# Patient Record
Sex: Female | Born: 1940 | Race: White | Hispanic: No | Marital: Married
Health system: Southern US, Community
[De-identification: ages and names within clinical notes are randomized; demographics above are authoritative.]

## PROBLEM LIST (undated history)

## (undated) DIAGNOSIS — I2510 Atherosclerotic heart disease of native coronary artery without angina pectoris: Secondary | ICD-10-CM

## (undated) DIAGNOSIS — I10 Essential (primary) hypertension: Secondary | ICD-10-CM

---

## 2009-04-05 ENCOUNTER — Ambulatory Visit: Payer: Self-pay | Admitting: Internal Medicine

## 2016-11-15 DIAGNOSIS — Z1211 Encounter for screening for malignant neoplasm of colon: Secondary | ICD-10-CM | POA: Diagnosis not present

## 2016-11-21 DIAGNOSIS — I10 Essential (primary) hypertension: Secondary | ICD-10-CM | POA: Diagnosis not present

## 2017-02-13 DIAGNOSIS — I2510 Atherosclerotic heart disease of native coronary artery without angina pectoris: Secondary | ICD-10-CM | POA: Diagnosis not present

## 2017-02-13 DIAGNOSIS — I10 Essential (primary) hypertension: Secondary | ICD-10-CM | POA: Diagnosis not present

## 2017-02-13 DIAGNOSIS — I5022 Chronic systolic (congestive) heart failure: Secondary | ICD-10-CM | POA: Diagnosis not present

## 2017-03-07 DIAGNOSIS — D485 Neoplasm of uncertain behavior of skin: Secondary | ICD-10-CM | POA: Diagnosis not present

## 2017-03-07 DIAGNOSIS — D0439 Carcinoma in situ of skin of other parts of face: Secondary | ICD-10-CM | POA: Diagnosis not present

## 2017-03-22 DIAGNOSIS — C44321 Squamous cell carcinoma of skin of nose: Secondary | ICD-10-CM | POA: Diagnosis not present

## 2017-05-03 DIAGNOSIS — I25119 Atherosclerotic heart disease of native coronary artery with unspecified angina pectoris: Secondary | ICD-10-CM | POA: Diagnosis not present

## 2017-05-03 DIAGNOSIS — I10 Essential (primary) hypertension: Secondary | ICD-10-CM | POA: Diagnosis not present

## 2017-05-03 DIAGNOSIS — I2510 Atherosclerotic heart disease of native coronary artery without angina pectoris: Secondary | ICD-10-CM | POA: Diagnosis not present

## 2017-07-26 DIAGNOSIS — L570 Actinic keratosis: Secondary | ICD-10-CM | POA: Diagnosis not present

## 2017-07-26 DIAGNOSIS — Z872 Personal history of diseases of the skin and subcutaneous tissue: Secondary | ICD-10-CM | POA: Diagnosis not present

## 2017-07-26 DIAGNOSIS — L821 Other seborrheic keratosis: Secondary | ICD-10-CM | POA: Diagnosis not present

## 2017-07-26 DIAGNOSIS — Z859 Personal history of malignant neoplasm, unspecified: Secondary | ICD-10-CM | POA: Diagnosis not present

## 2017-10-08 DIAGNOSIS — Z859 Personal history of malignant neoplasm, unspecified: Secondary | ICD-10-CM | POA: Diagnosis not present

## 2017-10-08 DIAGNOSIS — Z872 Personal history of diseases of the skin and subcutaneous tissue: Secondary | ICD-10-CM | POA: Diagnosis not present

## 2017-10-08 DIAGNOSIS — L570 Actinic keratosis: Secondary | ICD-10-CM | POA: Diagnosis not present

## 2017-10-08 DIAGNOSIS — L578 Other skin changes due to chronic exposure to nonionizing radiation: Secondary | ICD-10-CM | POA: Diagnosis not present

## 2017-10-31 DIAGNOSIS — Q386 Other congenital malformations of mouth: Secondary | ICD-10-CM | POA: Diagnosis not present

## 2018-02-11 DIAGNOSIS — I2510 Atherosclerotic heart disease of native coronary artery without angina pectoris: Secondary | ICD-10-CM | POA: Diagnosis not present

## 2018-02-11 DIAGNOSIS — I10 Essential (primary) hypertension: Secondary | ICD-10-CM | POA: Diagnosis not present

## 2018-02-26 DIAGNOSIS — I2510 Atherosclerotic heart disease of native coronary artery without angina pectoris: Secondary | ICD-10-CM | POA: Diagnosis not present

## 2018-03-29 DIAGNOSIS — I10 Essential (primary) hypertension: Secondary | ICD-10-CM | POA: Diagnosis not present

## 2018-03-29 DIAGNOSIS — I2510 Atherosclerotic heart disease of native coronary artery without angina pectoris: Secondary | ICD-10-CM | POA: Diagnosis not present

## 2018-04-08 DIAGNOSIS — Z859 Personal history of malignant neoplasm, unspecified: Secondary | ICD-10-CM | POA: Diagnosis not present

## 2018-04-08 DIAGNOSIS — L821 Other seborrheic keratosis: Secondary | ICD-10-CM | POA: Diagnosis not present

## 2018-04-08 DIAGNOSIS — Z872 Personal history of diseases of the skin and subcutaneous tissue: Secondary | ICD-10-CM | POA: Diagnosis not present

## 2018-04-08 DIAGNOSIS — L578 Other skin changes due to chronic exposure to nonionizing radiation: Secondary | ICD-10-CM | POA: Diagnosis not present

## 2018-05-17 DIAGNOSIS — I10 Essential (primary) hypertension: Secondary | ICD-10-CM | POA: Diagnosis not present

## 2018-07-02 DIAGNOSIS — Z13220 Encounter for screening for lipoid disorders: Secondary | ICD-10-CM | POA: Diagnosis not present

## 2018-07-02 DIAGNOSIS — I10 Essential (primary) hypertension: Secondary | ICD-10-CM | POA: Diagnosis not present

## 2019-04-14 DIAGNOSIS — Z872 Personal history of diseases of the skin and subcutaneous tissue: Secondary | ICD-10-CM | POA: Diagnosis not present

## 2019-04-14 DIAGNOSIS — L570 Actinic keratosis: Secondary | ICD-10-CM | POA: Diagnosis not present

## 2019-04-14 DIAGNOSIS — Z859 Personal history of malignant neoplasm, unspecified: Secondary | ICD-10-CM | POA: Diagnosis not present

## 2019-04-14 DIAGNOSIS — L578 Other skin changes due to chronic exposure to nonionizing radiation: Secondary | ICD-10-CM | POA: Diagnosis not present

## 2019-06-17 DIAGNOSIS — L821 Other seborrheic keratosis: Secondary | ICD-10-CM | POA: Diagnosis not present

## 2019-10-27 DIAGNOSIS — R197 Diarrhea, unspecified: Secondary | ICD-10-CM | POA: Diagnosis not present

## 2019-10-27 DIAGNOSIS — I5022 Chronic systolic (congestive) heart failure: Secondary | ICD-10-CM | POA: Diagnosis not present

## 2019-10-27 DIAGNOSIS — I10 Essential (primary) hypertension: Secondary | ICD-10-CM | POA: Diagnosis not present

## 2019-10-27 DIAGNOSIS — Z1211 Encounter for screening for malignant neoplasm of colon: Secondary | ICD-10-CM | POA: Diagnosis not present

## 2019-12-03 DIAGNOSIS — Z1159 Encounter for screening for other viral diseases: Secondary | ICD-10-CM | POA: Diagnosis not present

## 2019-12-03 DIAGNOSIS — Z1211 Encounter for screening for malignant neoplasm of colon: Secondary | ICD-10-CM | POA: Diagnosis not present

## 2019-12-03 DIAGNOSIS — I10 Essential (primary) hypertension: Secondary | ICD-10-CM | POA: Diagnosis not present

## 2019-12-03 DIAGNOSIS — R197 Diarrhea, unspecified: Secondary | ICD-10-CM | POA: Diagnosis not present

## 2019-12-16 DIAGNOSIS — Z1211 Encounter for screening for malignant neoplasm of colon: Secondary | ICD-10-CM | POA: Diagnosis not present

## 2020-03-05 DIAGNOSIS — R079 Chest pain, unspecified: Secondary | ICD-10-CM | POA: Diagnosis not present

## 2020-03-05 DIAGNOSIS — I10 Essential (primary) hypertension: Secondary | ICD-10-CM | POA: Diagnosis not present

## 2020-03-05 DIAGNOSIS — I25119 Atherosclerotic heart disease of native coronary artery with unspecified angina pectoris: Secondary | ICD-10-CM | POA: Diagnosis not present

## 2020-03-05 DIAGNOSIS — I509 Heart failure, unspecified: Secondary | ICD-10-CM | POA: Diagnosis not present

## 2020-03-12 DIAGNOSIS — R531 Weakness: Secondary | ICD-10-CM | POA: Diagnosis not present

## 2020-03-12 DIAGNOSIS — Z1159 Encounter for screening for other viral diseases: Secondary | ICD-10-CM | POA: Diagnosis not present

## 2020-03-12 DIAGNOSIS — R197 Diarrhea, unspecified: Secondary | ICD-10-CM | POA: Diagnosis not present

## 2020-05-11 DIAGNOSIS — Z0000 Encounter for general adult medical examination without abnormal findings: Secondary | ICD-10-CM | POA: Diagnosis not present

## 2020-05-11 DIAGNOSIS — I351 Nonrheumatic aortic (valve) insufficiency: Secondary | ICD-10-CM | POA: Diagnosis not present

## 2020-05-11 DIAGNOSIS — I209 Angina pectoris, unspecified: Secondary | ICD-10-CM | POA: Diagnosis not present

## 2020-08-25 DIAGNOSIS — T447X6A Underdosing of beta-adrenoreceptor antagonists, initial encounter: Secondary | ICD-10-CM | POA: Diagnosis not present

## 2020-08-25 DIAGNOSIS — I5023 Acute on chronic systolic (congestive) heart failure: Secondary | ICD-10-CM | POA: Diagnosis not present

## 2020-08-25 DIAGNOSIS — I358 Other nonrheumatic aortic valve disorders: Secondary | ICD-10-CM | POA: Diagnosis not present

## 2020-08-25 DIAGNOSIS — R0600 Dyspnea, unspecified: Secondary | ICD-10-CM | POA: Diagnosis not present

## 2020-08-25 DIAGNOSIS — I77810 Thoracic aortic ectasia: Secondary | ICD-10-CM | POA: Diagnosis not present

## 2020-08-25 DIAGNOSIS — R7989 Other specified abnormal findings of blood chemistry: Secondary | ICD-10-CM | POA: Diagnosis not present

## 2020-08-25 DIAGNOSIS — R0902 Hypoxemia: Secondary | ICD-10-CM | POA: Diagnosis not present

## 2020-08-25 DIAGNOSIS — H9190 Unspecified hearing loss, unspecified ear: Secondary | ICD-10-CM | POA: Diagnosis not present

## 2020-08-25 DIAGNOSIS — R918 Other nonspecific abnormal finding of lung field: Secondary | ICD-10-CM | POA: Diagnosis not present

## 2020-08-25 DIAGNOSIS — J811 Chronic pulmonary edema: Secondary | ICD-10-CM | POA: Diagnosis not present

## 2020-08-25 DIAGNOSIS — R945 Abnormal results of liver function studies: Secondary | ICD-10-CM | POA: Diagnosis not present

## 2020-08-25 DIAGNOSIS — R262 Difficulty in walking, not elsewhere classified: Secondary | ICD-10-CM | POA: Diagnosis not present

## 2020-08-25 DIAGNOSIS — R0602 Shortness of breath: Secondary | ICD-10-CM | POA: Diagnosis not present

## 2020-08-25 DIAGNOSIS — Z9114 Patient's other noncompliance with medication regimen: Secondary | ICD-10-CM | POA: Diagnosis not present

## 2020-08-25 DIAGNOSIS — Z91128 Patient's intentional underdosing of medication regimen for other reason: Secondary | ICD-10-CM | POA: Diagnosis not present

## 2020-08-25 DIAGNOSIS — I351 Nonrheumatic aortic (valve) insufficiency: Secondary | ICD-10-CM | POA: Diagnosis not present

## 2020-08-25 DIAGNOSIS — Z20822 Contact with and (suspected) exposure to covid-19: Secondary | ICD-10-CM | POA: Diagnosis not present

## 2020-08-25 DIAGNOSIS — I21A1 Myocardial infarction type 2: Secondary | ICD-10-CM | POA: Diagnosis not present

## 2020-08-25 DIAGNOSIS — R0609 Other forms of dyspnea: Secondary | ICD-10-CM | POA: Diagnosis not present

## 2020-08-25 DIAGNOSIS — I5021 Acute systolic (congestive) heart failure: Secondary | ICD-10-CM | POA: Diagnosis not present

## 2020-08-25 DIAGNOSIS — I110 Hypertensive heart disease with heart failure: Secondary | ICD-10-CM | POA: Diagnosis not present

## 2020-08-25 DIAGNOSIS — E871 Hypo-osmolality and hyponatremia: Secondary | ICD-10-CM | POA: Diagnosis not present

## 2020-08-25 DIAGNOSIS — I2510 Atherosclerotic heart disease of native coronary artery without angina pectoris: Secondary | ICD-10-CM | POA: Diagnosis not present

## 2020-08-26 DIAGNOSIS — I5021 Acute systolic (congestive) heart failure: Secondary | ICD-10-CM | POA: Diagnosis not present

## 2020-08-26 DIAGNOSIS — I080 Rheumatic disorders of both mitral and aortic valves: Secondary | ICD-10-CM | POA: Diagnosis not present

## 2020-08-27 DIAGNOSIS — I110 Hypertensive heart disease with heart failure: Secondary | ICD-10-CM | POA: Diagnosis not present

## 2020-08-27 DIAGNOSIS — I21A1 Myocardial infarction type 2: Secondary | ICD-10-CM | POA: Diagnosis not present

## 2020-08-27 DIAGNOSIS — I5021 Acute systolic (congestive) heart failure: Secondary | ICD-10-CM | POA: Diagnosis not present

## 2020-08-27 DIAGNOSIS — I2510 Atherosclerotic heart disease of native coronary artery without angina pectoris: Secondary | ICD-10-CM | POA: Diagnosis not present

## 2020-08-27 DIAGNOSIS — E8770 Fluid overload, unspecified: Secondary | ICD-10-CM | POA: Diagnosis not present

## 2020-08-28 DIAGNOSIS — J811 Chronic pulmonary edema: Secondary | ICD-10-CM | POA: Diagnosis not present

## 2020-08-28 DIAGNOSIS — I5021 Acute systolic (congestive) heart failure: Secondary | ICD-10-CM | POA: Diagnosis not present

## 2020-08-28 DIAGNOSIS — I2510 Atherosclerotic heart disease of native coronary artery without angina pectoris: Secondary | ICD-10-CM | POA: Diagnosis not present

## 2020-08-28 DIAGNOSIS — I110 Hypertensive heart disease with heart failure: Secondary | ICD-10-CM | POA: Diagnosis not present

## 2020-08-28 DIAGNOSIS — J90 Pleural effusion, not elsewhere classified: Secondary | ICD-10-CM | POA: Diagnosis not present

## 2020-08-28 DIAGNOSIS — I21A1 Myocardial infarction type 2: Secondary | ICD-10-CM | POA: Diagnosis not present

## 2020-09-06 DIAGNOSIS — I5022 Chronic systolic (congestive) heart failure: Secondary | ICD-10-CM | POA: Diagnosis not present

## 2020-09-06 DIAGNOSIS — I10 Essential (primary) hypertension: Secondary | ICD-10-CM | POA: Diagnosis not present

## 2020-09-06 DIAGNOSIS — I5021 Acute systolic (congestive) heart failure: Secondary | ICD-10-CM | POA: Diagnosis not present

## 2020-09-09 DIAGNOSIS — I5020 Unspecified systolic (congestive) heart failure: Secondary | ICD-10-CM | POA: Diagnosis not present

## 2020-09-09 DIAGNOSIS — I5022 Chronic systolic (congestive) heart failure: Secondary | ICD-10-CM | POA: Diagnosis not present

## 2020-09-09 DIAGNOSIS — Z09 Encounter for follow-up examination after completed treatment for conditions other than malignant neoplasm: Secondary | ICD-10-CM | POA: Diagnosis not present

## 2020-09-09 DIAGNOSIS — I25119 Atherosclerotic heart disease of native coronary artery with unspecified angina pectoris: Secondary | ICD-10-CM | POA: Diagnosis not present

## 2020-09-16 DIAGNOSIS — I10 Essential (primary) hypertension: Secondary | ICD-10-CM | POA: Diagnosis not present

## 2020-09-16 DIAGNOSIS — I5021 Acute systolic (congestive) heart failure: Secondary | ICD-10-CM | POA: Diagnosis not present

## 2020-09-16 DIAGNOSIS — R197 Diarrhea, unspecified: Secondary | ICD-10-CM | POA: Diagnosis not present

## 2020-09-16 DIAGNOSIS — I509 Heart failure, unspecified: Secondary | ICD-10-CM | POA: Diagnosis not present

## 2020-09-21 DIAGNOSIS — I509 Heart failure, unspecified: Secondary | ICD-10-CM | POA: Diagnosis not present

## 2020-10-05 DIAGNOSIS — Z880 Allergy status to penicillin: Secondary | ICD-10-CM | POA: Diagnosis not present

## 2020-10-05 DIAGNOSIS — I5021 Acute systolic (congestive) heart failure: Secondary | ICD-10-CM | POA: Diagnosis not present

## 2020-10-05 DIAGNOSIS — I110 Hypertensive heart disease with heart failure: Secondary | ICD-10-CM | POA: Diagnosis not present

## 2020-10-05 DIAGNOSIS — Z79899 Other long term (current) drug therapy: Secondary | ICD-10-CM | POA: Diagnosis not present

## 2020-10-05 DIAGNOSIS — E785 Hyperlipidemia, unspecified: Secondary | ICD-10-CM | POA: Diagnosis not present

## 2020-10-05 DIAGNOSIS — R9431 Abnormal electrocardiogram [ECG] [EKG]: Secondary | ICD-10-CM | POA: Diagnosis not present

## 2020-10-05 DIAGNOSIS — I272 Pulmonary hypertension, unspecified: Secondary | ICD-10-CM | POA: Diagnosis not present

## 2020-10-05 DIAGNOSIS — I77810 Thoracic aortic ectasia: Secondary | ICD-10-CM | POA: Diagnosis not present

## 2020-10-05 DIAGNOSIS — I509 Heart failure, unspecified: Secondary | ICD-10-CM | POA: Diagnosis not present

## 2020-10-05 DIAGNOSIS — I2510 Atherosclerotic heart disease of native coronary artery without angina pectoris: Secondary | ICD-10-CM | POA: Diagnosis not present

## 2020-10-05 DIAGNOSIS — I428 Other cardiomyopathies: Secondary | ICD-10-CM | POA: Diagnosis not present

## 2020-10-05 DIAGNOSIS — I083 Combined rheumatic disorders of mitral, aortic and tricuspid valves: Secondary | ICD-10-CM | POA: Diagnosis not present

## 2020-10-05 DIAGNOSIS — I5041 Acute combined systolic (congestive) and diastolic (congestive) heart failure: Secondary | ICD-10-CM | POA: Diagnosis not present

## 2020-10-05 DIAGNOSIS — I080 Rheumatic disorders of both mitral and aortic valves: Secondary | ICD-10-CM | POA: Diagnosis not present

## 2020-11-05 DIAGNOSIS — I509 Heart failure, unspecified: Secondary | ICD-10-CM | POA: Diagnosis not present

## 2020-11-10 DIAGNOSIS — I10 Essential (primary) hypertension: Secondary | ICD-10-CM | POA: Diagnosis not present

## 2020-11-10 DIAGNOSIS — Z780 Asymptomatic menopausal state: Secondary | ICD-10-CM | POA: Diagnosis not present

## 2020-11-10 DIAGNOSIS — I5022 Chronic systolic (congestive) heart failure: Secondary | ICD-10-CM | POA: Diagnosis not present

## 2020-11-10 DIAGNOSIS — K591 Functional diarrhea: Secondary | ICD-10-CM | POA: Diagnosis not present

## 2021-01-04 DIAGNOSIS — I77810 Thoracic aortic ectasia: Secondary | ICD-10-CM | POA: Diagnosis not present

## 2021-01-04 DIAGNOSIS — Z880 Allergy status to penicillin: Secondary | ICD-10-CM | POA: Diagnosis not present

## 2021-01-04 DIAGNOSIS — I509 Heart failure, unspecified: Secondary | ICD-10-CM | POA: Diagnosis not present

## 2021-01-04 DIAGNOSIS — I10 Essential (primary) hypertension: Secondary | ICD-10-CM | POA: Diagnosis not present

## 2021-01-04 DIAGNOSIS — I2510 Atherosclerotic heart disease of native coronary artery without angina pectoris: Secondary | ICD-10-CM | POA: Diagnosis not present

## 2021-01-04 DIAGNOSIS — I110 Hypertensive heart disease with heart failure: Secondary | ICD-10-CM | POA: Diagnosis not present

## 2021-01-04 DIAGNOSIS — I428 Other cardiomyopathies: Secondary | ICD-10-CM | POA: Diagnosis not present

## 2021-01-04 DIAGNOSIS — E785 Hyperlipidemia, unspecified: Secondary | ICD-10-CM | POA: Diagnosis not present

## 2021-01-04 DIAGNOSIS — G4700 Insomnia, unspecified: Secondary | ICD-10-CM | POA: Diagnosis not present

## 2021-01-04 DIAGNOSIS — I5022 Chronic systolic (congestive) heart failure: Secondary | ICD-10-CM | POA: Diagnosis not present

## 2021-01-08 ENCOUNTER — Inpatient Hospital Stay: Payer: Medicare Other

## 2021-01-08 ENCOUNTER — Emergency Department: Payer: Medicare Other

## 2021-01-08 ENCOUNTER — Other Ambulatory Visit: Payer: Self-pay

## 2021-01-08 ENCOUNTER — Inpatient Hospital Stay
Admission: EM | Admit: 2021-01-08 | Discharge: 2021-01-12 | DRG: 064 | Disposition: A | Discharge status: Hospice | Payer: Medicare Other | Attending: Internal Medicine | Admitting: Internal Medicine

## 2021-01-08 DIAGNOSIS — J9601 Acute respiratory failure with hypoxia: Secondary | ICD-10-CM | POA: Diagnosis not present

## 2021-01-08 DIAGNOSIS — G9341 Metabolic encephalopathy: Secondary | ICD-10-CM | POA: Diagnosis present

## 2021-01-08 DIAGNOSIS — Z20822 Contact with and (suspected) exposure to covid-19: Secondary | ICD-10-CM | POA: Diagnosis present

## 2021-01-08 DIAGNOSIS — I480 Paroxysmal atrial fibrillation: Secondary | ICD-10-CM | POA: Diagnosis present

## 2021-01-08 DIAGNOSIS — Z6821 Body mass index (BMI) 21.0-21.9, adult: Secondary | ICD-10-CM

## 2021-01-08 DIAGNOSIS — E875 Hyperkalemia: Secondary | ICD-10-CM | POA: Diagnosis present

## 2021-01-08 DIAGNOSIS — R400 Somnolence: Secondary | ICD-10-CM | POA: Diagnosis not present

## 2021-01-08 DIAGNOSIS — J90 Pleural effusion, not elsewhere classified: Secondary | ICD-10-CM | POA: Diagnosis not present

## 2021-01-08 DIAGNOSIS — I110 Hypertensive heart disease with heart failure: Secondary | ICD-10-CM | POA: Diagnosis present

## 2021-01-08 DIAGNOSIS — E440 Moderate protein-calorie malnutrition: Secondary | ICD-10-CM | POA: Diagnosis present

## 2021-01-08 DIAGNOSIS — Z66 Do not resuscitate: Secondary | ICD-10-CM | POA: Diagnosis present

## 2021-01-08 DIAGNOSIS — Z7189 Other specified counseling: Secondary | ICD-10-CM | POA: Diagnosis not present

## 2021-01-08 DIAGNOSIS — E872 Acidosis: Secondary | ICD-10-CM | POA: Diagnosis not present

## 2021-01-08 DIAGNOSIS — I2510 Atherosclerotic heart disease of native coronary artery without angina pectoris: Secondary | ICD-10-CM | POA: Diagnosis present

## 2021-01-08 DIAGNOSIS — Z515 Encounter for palliative care: Secondary | ICD-10-CM

## 2021-01-08 DIAGNOSIS — E878 Other disorders of electrolyte and fluid balance, not elsewhere classified: Secondary | ICD-10-CM | POA: Diagnosis present

## 2021-01-08 DIAGNOSIS — I517 Cardiomegaly: Secondary | ICD-10-CM | POA: Diagnosis not present

## 2021-01-08 DIAGNOSIS — I639 Cerebral infarction, unspecified: Secondary | ICD-10-CM | POA: Diagnosis not present

## 2021-01-08 DIAGNOSIS — J690 Pneumonitis due to inhalation of food and vomit: Secondary | ICD-10-CM | POA: Diagnosis present

## 2021-01-08 DIAGNOSIS — I5023 Acute on chronic systolic (congestive) heart failure: Secondary | ICD-10-CM

## 2021-01-08 DIAGNOSIS — Z882 Allergy status to sulfonamides status: Secondary | ICD-10-CM

## 2021-01-08 DIAGNOSIS — Z9119 Patient's noncompliance with other medical treatment and regimen: Secondary | ICD-10-CM

## 2021-01-08 DIAGNOSIS — N179 Acute kidney failure, unspecified: Secondary | ICD-10-CM | POA: Diagnosis present

## 2021-01-08 DIAGNOSIS — R29810 Facial weakness: Secondary | ICD-10-CM | POA: Diagnosis not present

## 2021-01-08 DIAGNOSIS — I63411 Cerebral infarction due to embolism of right middle cerebral artery: Secondary | ICD-10-CM | POA: Diagnosis not present

## 2021-01-08 DIAGNOSIS — R0602 Shortness of breath: Secondary | ICD-10-CM | POA: Diagnosis not present

## 2021-01-08 DIAGNOSIS — I5021 Acute systolic (congestive) heart failure: Secondary | ICD-10-CM | POA: Diagnosis not present

## 2021-01-08 DIAGNOSIS — R531 Weakness: Secondary | ICD-10-CM | POA: Diagnosis not present

## 2021-01-08 DIAGNOSIS — R799 Abnormal finding of blood chemistry, unspecified: Secondary | ICD-10-CM

## 2021-01-08 DIAGNOSIS — I447 Left bundle-branch block, unspecified: Secondary | ICD-10-CM | POA: Diagnosis present

## 2021-01-08 DIAGNOSIS — Z4659 Encounter for fitting and adjustment of other gastrointestinal appliance and device: Secondary | ICD-10-CM

## 2021-01-08 DIAGNOSIS — R414 Neurologic neglect syndrome: Secondary | ICD-10-CM | POA: Diagnosis present

## 2021-01-08 DIAGNOSIS — R471 Dysarthria and anarthria: Secondary | ICD-10-CM | POA: Diagnosis present

## 2021-01-08 DIAGNOSIS — G8194 Hemiplegia, unspecified affecting left nondominant side: Secondary | ICD-10-CM | POA: Diagnosis not present

## 2021-01-08 DIAGNOSIS — I248 Other forms of acute ischemic heart disease: Secondary | ICD-10-CM | POA: Diagnosis present

## 2021-01-08 DIAGNOSIS — E1165 Type 2 diabetes mellitus with hyperglycemia: Secondary | ICD-10-CM | POA: Diagnosis present

## 2021-01-08 DIAGNOSIS — I10 Essential (primary) hypertension: Secondary | ICD-10-CM

## 2021-01-08 DIAGNOSIS — Z9109 Other allergy status, other than to drugs and biological substances: Secondary | ICD-10-CM

## 2021-01-08 DIAGNOSIS — Z7984 Long term (current) use of oral hypoglycemic drugs: Secondary | ICD-10-CM

## 2021-01-08 DIAGNOSIS — I509 Heart failure, unspecified: Secondary | ICD-10-CM

## 2021-01-08 DIAGNOSIS — Z0189 Encounter for other specified special examinations: Secondary | ICD-10-CM

## 2021-01-08 DIAGNOSIS — Z79899 Other long term (current) drug therapy: Secondary | ICD-10-CM

## 2021-01-08 DIAGNOSIS — R778 Other specified abnormalities of plasma proteins: Secondary | ICD-10-CM | POA: Diagnosis not present

## 2021-01-08 DIAGNOSIS — I4891 Unspecified atrial fibrillation: Secondary | ICD-10-CM | POA: Diagnosis not present

## 2021-01-08 DIAGNOSIS — Z881 Allergy status to other antibiotic agents status: Secondary | ICD-10-CM

## 2021-01-08 DIAGNOSIS — E559 Vitamin D deficiency, unspecified: Secondary | ICD-10-CM | POA: Diagnosis present

## 2021-01-08 DIAGNOSIS — Z9114 Patient's other noncompliance with medication regimen: Secondary | ICD-10-CM

## 2021-01-08 DIAGNOSIS — E871 Hypo-osmolality and hyponatremia: Secondary | ICD-10-CM | POA: Diagnosis present

## 2021-01-08 DIAGNOSIS — Z978 Presence of other specified devices: Secondary | ICD-10-CM

## 2021-01-08 DIAGNOSIS — D696 Thrombocytopenia, unspecified: Secondary | ICD-10-CM | POA: Diagnosis present

## 2021-01-08 DIAGNOSIS — Z4682 Encounter for fitting and adjustment of non-vascular catheter: Secondary | ICD-10-CM | POA: Diagnosis not present

## 2021-01-08 DIAGNOSIS — Z91018 Allergy to other foods: Secondary | ICD-10-CM

## 2021-01-08 DIAGNOSIS — Z8249 Family history of ischemic heart disease and other diseases of the circulatory system: Secondary | ICD-10-CM

## 2021-01-08 HISTORY — DX: Atherosclerotic heart disease of native coronary artery without angina pectoris: I25.10

## 2021-01-08 HISTORY — DX: Essential (primary) hypertension: I10

## 2021-01-08 LAB — BLOOD GAS, ARTERIAL
Acid-base deficit: 0.8 mmol/L (ref 0.0–2.0)
Bicarbonate: 22.2 mmol/L (ref 20.0–28.0)
FIO2: 0.28
O2 Saturation: 98.7 %
Patient temperature: 37
pCO2 arterial: 32 mmHg (ref 32.0–48.0)
pH, Arterial: 7.45 (ref 7.350–7.450)
pO2, Arterial: 115 mmHg — ABNORMAL HIGH (ref 83.0–108.0)

## 2021-01-08 LAB — BASIC METABOLIC PANEL
Anion gap: 17 — ABNORMAL HIGH (ref 5–15)
Anion gap: 21 — ABNORMAL HIGH (ref 5–15)
Anion gap: 17 — ABNORMAL HIGH (ref 5–15)
BUN: 52 mg/dL — ABNORMAL HIGH (ref 8–23)
BUN: 55 mg/dL — ABNORMAL HIGH (ref 8–23)
BUN: 55 mg/dL — ABNORMAL HIGH (ref 8–23)
CO2: 23 mmol/L (ref 22–32)
CO2: 17 mmol/L — ABNORMAL LOW (ref 22–32)
CO2: 23 mmol/L (ref 22–32)
Calcium: 9.5 mg/dL (ref 8.9–10.3)
Calcium: 9.7 mg/dL (ref 8.9–10.3)
Calcium: 9.8 mg/dL (ref 8.9–10.3)
Chloride: 89 mmol/L — ABNORMAL LOW (ref 98–111)
Chloride: 91 mmol/L — ABNORMAL LOW (ref 98–111)
Chloride: 88 mmol/L — ABNORMAL LOW (ref 98–111)
Creatinine, Ser: 1.03 mg/dL — ABNORMAL HIGH (ref 0.44–1.00)
Creatinine, Ser: 1.04 mg/dL — ABNORMAL HIGH (ref 0.44–1.00)
Creatinine, Ser: 1.04 mg/dL — ABNORMAL HIGH (ref 0.44–1.00)
GFR, Estimated: 56 mL/min — ABNORMAL LOW (ref >60)
GFR, Estimated: 55 mL/min — ABNORMAL LOW (ref >60)
GFR, Estimated: 55 mL/min — ABNORMAL LOW (ref >60)
Glucose, Bld: 236 mg/dL — ABNORMAL HIGH (ref 70–99)
Glucose, Bld: 265 mg/dL — ABNORMAL HIGH (ref 70–99)
Glucose, Bld: 290 mg/dL — ABNORMAL HIGH (ref 70–99)
Potassium: 5.3 mmol/L — ABNORMAL HIGH (ref 3.5–5.1)
Potassium: 6.5 mmol/L (ref 3.5–5.1)
Potassium: 4.8 mmol/L (ref 3.5–5.1)
Sodium: 129 mmol/L — ABNORMAL LOW (ref 135–145)
Sodium: 129 mmol/L — ABNORMAL LOW (ref 135–145)
Sodium: 128 mmol/L — ABNORMAL LOW (ref 135–145)

## 2021-01-08 LAB — CBC WITH DIFFERENTIAL/PLATELET
Abs Immature Granulocytes: 0.06 10*3/uL (ref 0.00–0.07)
Basophils Absolute: 0 10*3/uL (ref 0.0–0.1)
Basophils Relative: 0 %
Eosinophils Absolute: 0 10*3/uL (ref 0.0–0.5)
Eosinophils Relative: 0 %
HCT: 41.5 % (ref 36.0–46.0)
Hemoglobin: 14.3 g/dL (ref 12.0–15.0)
Immature Granulocytes: 1 %
Lymphocytes Relative: 5 %
Lymphs Abs: 0.6 10*3/uL — ABNORMAL LOW (ref 0.7–4.0)
MCH: 31.3 pg (ref 26.0–34.0)
MCHC: 34.5 g/dL (ref 30.0–36.0)
MCV: 90.8 fL (ref 80.0–100.0)
Monocytes Absolute: 0.7 10*3/uL (ref 0.1–1.0)
Monocytes Relative: 6 %
Neutro Abs: 10.4 10*3/uL — ABNORMAL HIGH (ref 1.7–7.7)
Neutrophils Relative %: 88 %
Platelets: 162 10*3/uL (ref 150–400)
RBC: 4.57 MIL/uL (ref 3.87–5.11)
RDW: 15.1 % (ref 11.5–15.5)
WBC: 11.7 10*3/uL — ABNORMAL HIGH (ref 4.0–10.5)
nRBC: 0 % (ref 0.0–0.2)

## 2021-01-08 LAB — RESP PANEL BY RT-PCR (FLU A&B, COVID) ARPGX2
Influenza A by PCR: NEGATIVE
Influenza B by PCR: NEGATIVE
SARS Coronavirus 2 by RT PCR: NEGATIVE

## 2021-01-08 LAB — TSH: TSH: 1.733 u[IU]/mL (ref 0.350–4.500)

## 2021-01-08 LAB — BRAIN NATRIURETIC PEPTIDE: B Natriuretic Peptide: 3916.8 pg/mL — ABNORMAL HIGH (ref 0.0–100.0)

## 2021-01-08 LAB — TROPONIN I (HIGH SENSITIVITY)
Troponin I (High Sensitivity): 78 ng/L — ABNORMAL HIGH (ref <18)
Troponin I (High Sensitivity): 77 ng/L — ABNORMAL HIGH (ref <18)

## 2021-01-08 LAB — GLUCOSE, CAPILLARY
Glucose-Capillary: 245 mg/dL — ABNORMAL HIGH (ref 70–99)
Glucose-Capillary: 274 mg/dL — ABNORMAL HIGH (ref 70–99)
Glucose-Capillary: 314 mg/dL — ABNORMAL HIGH (ref 70–99)
Glucose-Capillary: 310 mg/dL — ABNORMAL HIGH (ref 70–99)
Glucose-Capillary: 309 mg/dL — ABNORMAL HIGH (ref 70–99)
Glucose-Capillary: 271 mg/dL — ABNORMAL HIGH (ref 70–99)
Glucose-Capillary: 232 mg/dL — ABNORMAL HIGH (ref 70–99)
Glucose-Capillary: 103 mg/dL — ABNORMAL HIGH (ref 70–99)

## 2021-01-08 LAB — HEMOGLOBIN A1C
Hgb A1c MFr Bld: 7.2 % — ABNORMAL HIGH (ref 4.8–5.6)
Mean Plasma Glucose: 159.94 mg/dL

## 2021-01-08 LAB — BLOOD GAS, VENOUS
Acid-base deficit: 2.8 mmol/L — ABNORMAL HIGH (ref 0.0–2.0)
Bicarbonate: 20.9 mmol/L (ref 20.0–28.0)
O2 Saturation: 79.1 %
Patient temperature: 37
pCO2, Ven: 33 mmHg — ABNORMAL LOW (ref 44.0–60.0)
pH, Ven: 7.41 (ref 7.250–7.430)
pO2, Ven: 43 mmHg (ref 32.0–45.0)

## 2021-01-08 LAB — MAGNESIUM
Magnesium: 2.8 mg/dL — ABNORMAL HIGH (ref 1.7–2.4)
Magnesium: 2.5 mg/dL — ABNORMAL HIGH (ref 1.7–2.4)

## 2021-01-08 LAB — MRSA PCR SCREENING: MRSA by PCR: NEGATIVE

## 2021-01-08 MED ORDER — ONDANSETRON HCL 4 MG PO TABS: 4.0000 mg | ORAL_TABLET | Freq: Four times a day (QID) | ORAL | Status: DC | PRN

## 2021-01-08 MED ORDER — AMIODARONE HCL IN DEXTROSE 360-4.14 MG/200ML-% IV SOLN
60.0000 mg/h | INTRAVENOUS | Status: DC
  Administered 2021-01-08 (×2): 60 mg/h via INTRAVENOUS
  Filled 2021-01-08: qty 200

## 2021-01-08 MED ORDER — MAGNESIUM SULFATE 2 GM/50ML IV SOLN
2.0000 g | Freq: Once | INTRAVENOUS | Status: AC
  Administered 2021-01-08: 2 g via INTRAVENOUS
  Filled 2021-01-08: qty 50

## 2021-01-08 MED ORDER — AMIODARONE LOAD VIA INFUSION
150.0000 mg | Freq: Once | INTRAVENOUS | Status: AC
  Filled 2021-01-08: qty 83.34

## 2021-01-08 MED ORDER — DEXTROSE 50 % IV SOLN
1.0000 | Freq: Once | INTRAVENOUS | Status: AC
  Administered 2021-01-08: 50 mL via INTRAVENOUS
  Filled 2021-01-08: qty 50

## 2021-01-08 MED ORDER — AMIODARONE HCL IN DEXTROSE 360-4.14 MG/200ML-% IV SOLN
30.0000 mg/h | INTRAVENOUS | Status: DC
  Administered 2021-01-08 – 2021-01-10 (×4): 30 mg/h via INTRAVENOUS
  Filled 2021-01-08 (×4): qty 200

## 2021-01-08 MED ORDER — EMPAGLIFLOZIN 10 MG PO TABS
10.0000 mg | ORAL_TABLET | Freq: Every day | ORAL | Status: DC
  Filled 2021-01-08 (×3): qty 1

## 2021-01-08 MED ORDER — FUROSEMIDE 10 MG/ML IJ SOLN
20.0000 mg | Freq: Once | INTRAMUSCULAR | Status: AC
  Administered 2021-01-08: 20 mg via INTRAVENOUS
  Filled 2021-01-08: qty 4

## 2021-01-08 MED ORDER — TRAZODONE HCL 50 MG PO TABS: 25.0000 mg | ORAL_TABLET | Freq: Every evening | ORAL | Status: DC | PRN

## 2021-01-08 MED ORDER — FUROSEMIDE 10 MG/ML IJ SOLN: 40.0000 mg | Freq: Two times a day (BID) | INTRAMUSCULAR | Status: DC

## 2021-01-08 MED ORDER — INSULIN ASPART 100 UNIT/ML IV SOLN
10.0000 [IU] | Freq: Once | INTRAVENOUS | Status: AC
  Administered 2021-01-08: 10 [IU] via INTRAVENOUS
  Filled 2021-01-08: qty 0.1

## 2021-01-08 MED ORDER — AMIODARONE HCL IN DEXTROSE 360-4.14 MG/200ML-% IV SOLN
INTRAVENOUS | Status: AC
  Administered 2021-01-08: 150 mg via INTRAVENOUS
  Filled 2021-01-08: qty 200

## 2021-01-08 MED ORDER — ASCORBIC ACID 500 MG PO TABS: 500.0000 mg | ORAL_TABLET | Freq: Two times a day (BID) | ORAL | Status: DC

## 2021-01-08 MED ORDER — ASTAXANTHIN 4 MG PO CAPS: 1.0000 | ORAL_CAPSULE | Freq: Every day | ORAL | Status: DC

## 2021-01-08 MED ORDER — CALCIUM GLUCONATE-NACL 2-0.675 GM/100ML-% IV SOLN
2.0000 g | Freq: Once | INTRAVENOUS | Status: AC
  Administered 2021-01-08: 2000 mg via INTRAVENOUS
  Filled 2021-01-08: qty 100

## 2021-01-08 MED ORDER — MAGNESIUM HYDROXIDE 400 MG/5ML PO SUSP: 30.0000 mL | Freq: Every day | ORAL | Status: DC | PRN

## 2021-01-08 MED ORDER — INSULIN ASPART 100 UNIT/ML ~~LOC~~ SOLN
0.0000 [IU] | Freq: Three times a day (TID) | SUBCUTANEOUS | Status: DC
  Administered 2021-01-08: 5 [IU] via SUBCUTANEOUS
  Administered 2021-01-08: 11 [IU] via SUBCUTANEOUS
  Administered 2021-01-08: 8 [IU] via SUBCUTANEOUS
  Filled 2021-01-08 (×3): qty 1

## 2021-01-08 MED ORDER — CHLORHEXIDINE GLUCONATE CLOTH 2 % EX PADS
6.0000 | MEDICATED_PAD | Freq: Every day | CUTANEOUS | Status: DC
  Administered 2021-01-08 – 2021-01-10 (×3): 6 via TOPICAL

## 2021-01-08 MED ORDER — B COMPLEX-C PO TABS
ORAL_TABLET | Freq: Every day | ORAL | Status: DC
  Filled 2021-01-08 (×3): qty 1

## 2021-01-08 MED ORDER — ACETAMINOPHEN 325 MG PO TABS
650.0000 mg | ORAL_TABLET | Freq: Four times a day (QID) | ORAL | Status: DC | PRN
  Administered 2021-01-10: 650 mg via ORAL
  Filled 2021-01-08: qty 2

## 2021-01-08 MED ORDER — VITAMIN D3 25 MCG (1000 UNIT) PO TABS
2000.0000 [IU] | ORAL_TABLET | Freq: Every day | ORAL | Status: DC
  Filled 2021-01-08 (×4): qty 2

## 2021-01-08 MED ORDER — ASCORBIC ACID 500 MG PO TABS
500.0000 mg | ORAL_TABLET | Freq: Every day | ORAL | Status: DC
  Filled 2021-01-08: qty 1

## 2021-01-08 MED ORDER — FAMOTIDINE IN NACL 20-0.9 MG/50ML-% IV SOLN
20.0000 mg | Freq: Two times a day (BID) | INTRAVENOUS | Status: DC
  Administered 2021-01-08 – 2021-01-09 (×2): 20 mg via INTRAVENOUS
  Filled 2021-01-08 (×2): qty 50

## 2021-01-08 MED ORDER — ONDANSETRON HCL 4 MG/2ML IJ SOLN: 4.0000 mg | Freq: Four times a day (QID) | INTRAMUSCULAR | Status: DC | PRN

## 2021-01-08 MED ORDER — ADULT MULTIVITAMIN W/MINERALS CH
1.0000 | ORAL_TABLET | Freq: Every day | ORAL | Status: DC
  Filled 2021-01-08: qty 1

## 2021-01-08 MED ORDER — DILTIAZEM HCL 25 MG/5ML IV SOLN
INTRAVENOUS | Status: AC
  Filled 2021-01-08: qty 5

## 2021-01-08 MED ORDER — APIXABAN 5 MG PO TABS
5.0000 mg | ORAL_TABLET | Freq: Two times a day (BID) | ORAL | Status: DC
  Administered 2021-01-08: 5 mg via ORAL
  Filled 2021-01-08 (×2): qty 1

## 2021-01-08 MED ORDER — ACETAMINOPHEN 650 MG RE SUPP
650.0000 mg | Freq: Four times a day (QID) | RECTAL | Status: DC | PRN
  Administered 2021-01-08: 650 mg via RECTAL
  Filled 2021-01-08: qty 1

## 2021-01-08 MED ORDER — FUROSEMIDE 10 MG/ML IJ SOLN
60.0000 mg | Freq: Two times a day (BID) | INTRAMUSCULAR | Status: DC
  Administered 2021-01-08 – 2021-01-09 (×3): 60 mg via INTRAVENOUS
  Filled 2021-01-08 (×3): qty 6

## 2021-01-08 NOTE — Consult Note (Signed)
Cardiology Consultation Note    Patient ID: ULAH OLMO, MRN: [id number], DOB/AGE: Oct 10, 1940 80 y.o. Admit date: 01/08/2021   Date of Consult: 01/08/2021 Primary Physician: Zettie Pho, MD Primary Cardiologist: [facility]. Dr. Leta Baptist.  Chief Complaint: sob Reason for Consultation: chf Requesting MD: Dr. Jerral Ralph  HPI: Jaclyn Meadows is a 80 y.o. 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 She is scheduled to be on Entresto 49/51 mg bid, coreg 25 mg bid and lasix 20 mg daily. She appears to have been relatively noncompliant with her diuretics and also non compliant with her other meds. She has a mild hsTrop elevation which is flat c/w demand  At 78,77. BNP was 3916.  Last know BNP was 11/21 and it was 277. LDL 11/21 was 79  Was last seen by her primary cardiology office 4/5 and was stressed compliance with meds. CXR shows smal to moderate bilateral pleural effusions with cardiomegaly. EKG showed nsr with incomplete lbbb.   Past Medical History:  Diagnosis Date  . CAD (coronary artery disease)   . Hypertension       Surgical History:   Home Meds: Prior to Admission medications   Medication Sig Start Date End Date Taking? Authorizing Provider  Ascorbic Acid (VITAMIN C CR) 1000 MG TBCR Take by mouth. 11/26/12  Yes [provider]  Astaxanthin 4 MG CAPS Take 1 tablet by mouth daily.   Yes [provider]  B Complex Vitamins (VITAMIN B COMPLEX PO) Take 1 tablet by mouth daily. 11/26/12  Yes [provider]  Cholecalciferol 50 MCG (2000 UT) CAPS Take by mouth. 11/26/12  Yes [provider]  Digestive Enzymes  (ENZYME DIGEST) CAPS Take by mouth. 11/26/12  Yes [provider]  furosemide (LASIX) 20 MG tablet Take by mouth. 01/05/21  Yes [provider]  L-Lysine 1000 MG TABS Take by mouth. 11/26/12  Yes [provider]  Multiple Vitamin (DAILY VALUE MULTIVITAMIN) TABS Take by mouth. 11/26/12  Yes [provider]  telmisartan (MICARDIS) 20 MG tablet Take 10 mg by mouth daily. 11/16/20  Yes [provider]  bumetanide (BUMEX) 0.5 MG tablet Take 0.5 mg by mouth daily. 11/10/20   [provider]  carvedilol (COREG) 25 MG tablet Take 25 mg by mouth 2 (two) times daily. Patient not taking: No sig reported 08/29/20   [provider]  ENTRESTO 49-51 MG Take 1 tablet by mouth 2 (two) times daily. Patient not taking: No sig reported 10/04/20   [provider]  JARDIANCE 10 MG TABS tablet Take 10 mg by mouth daily. 10/05/20   [provider]  metoprolol succinate (TOPROL-XL) 100 MG 24 hr tablet Take 100 mg by mouth daily. Patient not taking: No sig reported 09/08/20   [provider]  spironolactone (ALDACTONE) 25 MG tablet Take 1 tablet by mouth daily. 10/07/20   [provider]  valsartan (DIOVAN) 40 MG tablet Take 40 mg by mouth daily. 10/25/20   [provider]    Inpatient Medications:  . apixaban  5 mg Oral BID  . ascorbic acid  500 mg  Oral Daily  . B-complex with vitamin C   Oral Daily  . Chlorhexidine Gluconate Cloth  6 each Topical Daily  . cholecalciferol  2,000 Units Oral Daily  . diltiazem      . empagliflozin  10 mg Oral Daily  . furosemide  60 mg Intravenous Q12H  . insulin aspart  0-15 Units Subcutaneous TID PC & HS  . multivitamin with minerals  1 tablet Oral Daily   . amiodarone 30 mg/hr (01/08/21 0840)    Allergies:  Allergies  Allergen Reactions  . Buckwheat Anaphylaxis  . Flaxseed (Linseed) Anaphylaxis  . Okra Anaphylaxis  . Other Anaphylaxis    Trout   . Strawberry Extract  Anaphylaxis  . Epinephrine Other (See Comments)    Prolonged numbness  . Neomycin Rash    Other reaction(s): UNKNOWN  . Sulfur Dioxide Dermatitis    Other reaction(s): UNKNOWN  . Azithromycin     Skin lesions  . Erythromycin   . Amoxicillin     dysuria    Social History   Socioeconomic History  . Marital status: Married    Spouse name: Not on file  . Number of children: Not on file  . Years of education: Not on file  . Highest education level: Not on file  Occupational History  . Not on file  Tobacco Use  . Smoking status: Not on file  . Smokeless tobacco: Not on file  Substance and Sexual Activity  . Alcohol use: Not on file  . Drug use: Not on file  . Sexual activity: Not on file  Other Topics Concern  . Not on file  Social History Narrative  . Not on file   Social Determinants of Health   Financial Resource Strain: Not on file  Food Insecurity: Not on file  Transportation Needs: Not on file  Physical Activity: Not on file  Stress: Not on file  Social Connections: Not on file  Intimate Partner Violence: Not on file     No family history on file.   Review of Systems: A 12-system review of systems was performed and is negative except as noted in the HPI.  Labs: No results for input(s): CKTOTAL, CKMB, TROPONINI in the last 72 hours. Lab Results  Component Value Date   WBC 11.7 (H) 01/08/2021   HGB 14.3 01/08/2021   HCT 41.5 01/08/2021   MCV 90.8 01/08/2021   PLT 162 01/08/2021    Recent Labs  Lab 01/08/21 0521  NA 129*  K 6.5*  CL 91*  CO2 17*  BUN 55*  CREATININE 1.04*  CALCIUM 9.7  GLUCOSE 265*   No results found for: CHOL, HDL, LDLCALC, TRIG No results found for: DDIMER  Radiology/Studies:  DG Chest Portable 1 View  Result Date: 01/08/2021 CLINICAL DATA:  Shortness of breath EXAM: PORTABLE CHEST 1 VIEW COMPARISON:  None. FINDINGS: Small moderate bilateral pleural effusions with basilar airspace disease. Cardiomegaly with aortic  atherosclerosis. No pneumothorax. IMPRESSION: Small to moderate bilateral pleural effusions with basilar airspace disease, atelectasis versus pneumonia. Cardiomegaly Electronically Signed   By: Jasmine Pang M.D.   On: 01/08/2021 02:56    Wt Readings from Last 3 Encounters:  01/08/21 53.9 kg    EKG: nsr with incomplete bbb  Physical Exam:lethargic female Blood pressure 127/82, pulse 78, temperature (!) 97.1 F (36.2 C), temperature source Axillary, resp. rate 19, height 5\' 2"  (1.575 m), weight 53.9 kg, SpO2 100 %. Body mass index is 21.73 kg/m. General: Well developed, well nourished, in no  acute distress. Head: Normocephalic, atraumatic, sclera non-icteric, no xanthomas, nares are without discharge.  Neck: Negative for carotid bruits. JVD not elevated. Lungs: Clear bilaterally to auscultation without wheezes, rales, or rhonchi. Breathing is unlabored. Heart: RRR with S1 S2. No murmurs, rubs, or gallops appreciated. Abdomen: Soft, non-tender, non-distended with normoactive bowel sounds. No hepatomegaly. No rebound/guarding. No obvious abdominal masses. Msk:  Strength and tone appear normal for age. Extremities: No clubbing or cyanosis. No edema.  Distal pedal pulses are 2+ and equal bilaterally. Neuro: Alert and oriented X 3. No facial asymmetry. No focal deficit. Moves all extremities spontaneously. Psych:  Responds to questions appropriately with a normal affect.     Assessment and Plan   1. 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   - 1-2+ pitting edema on exam. She notes increased shortness of breath over past 2 months. -TTE 10/05/20 with EF 15-20% -pt has as history of non compliance. States she takes meds but difficult historian -Will continue with careful diuresis following renal function, electrolytes and hemodynamics.  -will need discussion regarding icd prophylactically prior to discharge. APpears this has been addressed by her outpatient cardiologist.   2. Nonobstructive  CAD LHC 08/27/2020 mild disease (30 to 40% proximal and middle LAD, 50% proximal D2 branch, mild luminal irregularity left circumflex.) Patient has previously refused to take aspirin or statin therapy.   3. Mildly dilated thoracic aorta-will follow.   4. afib with rvr. Noted on telemetry and ekg on arrival. Now on amiodarone drip and in nsr. Will continue with amio iv and anticoagulate with eliquis. COnintue with coreg.      Signed, Dalia Heading MD 01/08/2021, 11:05 AM Pager: [phone]

## 2021-01-08 NOTE — Progress Notes (Signed)
Cross Cover Worsening hyperkalemia treated with calcium gluconate, insulin and dextrose.  Lasix increased to 60. Repeat levels for this afternoon VBG pending secondary to bicarb deficit seen of chem panel

## 2021-01-08 NOTE — Progress Notes (Addendum)
Patient seen and examined.  She was mostly sleepy.  She woke up and then went back to sleep.  Looks like she was in the emergency room all night yesterday not getting any meaningful sleep and rest.  80 year old female admitted early morning hours by nighttime hospitalist who has significant history of coronary artery disease, known congestive heart failure with ejection fraction less than 20% and hypertension admitted with worsening shortness of breath and lower extremity edema.  Also found to have rapid A. fib in the ER.  Patient remains on amiodarone IV after bolus and converted to sinus rhythm, she is therapeutic on Eliquis. Treated with gentle diuresis, will monitor.  Blood pressure is stable so far. Reported hyperkalemia in the morning labs, corrected now.  Entresto discontinued.  We will recheck tomorrow morning.  AddendumMicah Flesher to talk to patient's son [name] and updated him. Patient still looks tired and lethargic. Moans while sleeping and son witnessed that this is nothing new to her. I discussed poor ejection fraction / poor survival with any cardiac events due to very low EF. We discussed code status.  ABG and head CT ordered.  Continue supportive care . Full code but do not want prolonged CPR if no meaningful recovery. Consult palliative care team.  ABG looks fairly normal.

## 2021-01-08 NOTE — H&P (Signed)
[place]   PATIENT NAME: Jaclyn Meadows    MR#:  [id number]  DATE OF BIRTH:  08--1942  DATE OF ADMISSION:  01/08/2021  PRIMARY CARE PHYSICIAN: Zettie Pho, MD   Patient is coming from: Home  REQUESTING/REFERRING PHYSICIAN: Nita Sickle, MD  CHIEF COMPLAINT:   Chief Complaint  Patient presents with  . Weakness    HISTORY OF PRESENT ILLNESS:  Jaclyn Meadows is a 80 y.o. Caucasian female with medical history significant for coronary artery disease, CHF with EF of 20% and hypertension, who presented to the emergency room with acute onset generalized weakness with altered mental status for the last couple of days.  Patient admitted to worsening dyspnea and lower extremity edema.  She denies any cough or wheezing or hemoptysis.  No chest pain or palpitations.  Her Lasix dose has been recently changed due to CHF exacerbation per her son.  During my interview she was drowsy but arousable.  No fever or chills.  No nausea or vomiting or abdominal pain.    ED Course: Upon presentation to the emergency room heart rate was 124 and later 140, respiratory rate was 20 and later 26 with otherwise normal vital signs.  Labs revealed hyponatremia 129 hypochloremia of 89, hyperglycemia of 236, BUN of 52 with a creatinine of 1.03 anion gap of 17.  BNP was 3916.8 and high-sensitivity troponin I was 78.  CBC showed WBCs of 11.7 with neutrophilia.  Influenza antigens and COVID-19 PCR came back negative.  EKG as reviewed by me : showed atrial fibrillation with rapid ventricular response of 160 with LVH and she will conversion anterolaterally and Q waves anteroseptally Imaging: Chest x-ray showed small to moderate bilateral pleural effusion with bilateral airspace disease, atelectasis versus pneumonia, with cardiomegaly.  The patient was given IV amiodarone bolus followed by drip with conversion to normal sinus rhythm, 2 g of IV magnesium sulfate and 20 mg of IV Lasix.  She will be admitted to a  stepdown unit bed for further evaluation and management. PAST MEDICAL HISTORY:   Past Medical History:  Diagnosis Date  . CAD (coronary artery disease)   . Hypertension     PAST SURGICAL HISTORY:   Patient denies any previous surgeries. SOCIAL HISTORY:   Social History   Tobacco Use  . Smoking status: Not on file  . Smokeless tobacco: Not on file  Substance Use Topics  . Alcohol use: Not on file  No history of tobacco EtOH abuse or illicit drug use.  FAMILY HISTORY:  No family history on file. Positive for MI in her father. DRUG ALLERGIES:   Allergies  Allergen Reactions  . Buckwheat Anaphylaxis  . Flaxseed (Linseed) Anaphylaxis  . Okra Anaphylaxis  . Other Anaphylaxis    Trout   . Strawberry Extract Anaphylaxis  . Epinephrine Other (See Comments)    Prolonged numbness  . Neomycin Rash    Other reaction(s): UNKNOWN  . Sulfur Dioxide Dermatitis    Other reaction(s): UNKNOWN  . Azithromycin     Skin lesions  . Erythromycin   . Amoxicillin     dysuria    REVIEW OF SYSTEMS:   ROS As per history of present illness. All pertinent systems were reviewed above. Constitutional, HEENT, cardiovascular, respiratory, GI, GU, musculoskeletal, neuro, psychiatric, endocrine, integumentary and hematologic systems were reviewed and are otherwise negative/unremarkable except for positive findings mentioned above in the HPI.   MEDICATIONS AT HOME:   Prior to Admission medications   Medication Sig  Start Date End Date Taking? Authorizing Provider  Ascorbic Acid (VITAMIN C CR) 1000 MG TBCR Take by mouth. 11/26/12  Yes [provider]  Astaxanthin 4 MG CAPS Take 1 tablet by mouth daily.   Yes [provider]  B Complex Vitamins (VITAMIN B COMPLEX PO) Take 1 tablet by mouth daily. 11/26/12  Yes [provider]  Cholecalciferol 50 MCG (2000 UT) CAPS Take by mouth. 11/26/12  Yes [provider]  Digestive Enzymes (ENZYME DIGEST) CAPS Take by mouth.  11/26/12  Yes [provider]  furosemide (LASIX) 20 MG tablet Take by mouth. 01/05/21  Yes [provider]  L-Lysine 1000 MG TABS Take by mouth. 11/26/12  Yes [provider]  Multiple Vitamin (DAILY VALUE MULTIVITAMIN) TABS Take by mouth. 11/26/12  Yes [provider]  telmisartan (MICARDIS) 20 MG tablet Take 10 mg by mouth daily. 11/16/20  Yes [provider]  bumetanide (BUMEX) 0.5 MG tablet Take 0.5 mg by mouth daily. 11/10/20   [provider]  carvedilol (COREG) 25 MG tablet Take 25 mg by mouth 2 (two) times daily. Patient not taking: No sig reported 08/29/20   [provider]  ENTRESTO 49-51 MG Take 1 tablet by mouth 2 (two) times daily. Patient not taking: No sig reported 10/04/20   [provider]  JARDIANCE 10 MG TABS tablet Take 10 mg by mouth daily. 10/05/20   [provider]  metoprolol succinate (TOPROL-XL) 100 MG 24 hr tablet Take 100 mg by mouth daily. Patient not taking: No sig reported 09/08/20   [provider]  spironolactone (ALDACTONE) 25 MG tablet Take 1 tablet by mouth daily. 10/07/20   [provider]  valsartan (DIOVAN) 40 MG tablet Take 40 mg by mouth daily. 10/25/20   [provider]      VITAL SIGNS:  Blood pressure 124/85, pulse 87, temperature 97.7 F (36.5 C), temperature source Oral, resp. rate (!) 21, height 5\' 2"  (1.575 m), weight 54.4 kg, SpO2 100 %.  PHYSICAL EXAMINATION:  Physical Exam  GENERAL:  80 y.o.-year-old Caucasian female patient lying in the bed with no acute distress.  She was somnolent but arousable.  When arousable she was alert and oriented x3. EYES: Pupils equal, round, reactive to light and accommodation. No scleral icterus. Extraocular muscles intact.  HEENT: Head atraumatic, normocephalic. Oropharynx and nasopharynx clear.  NECK:  Supple, no jugular venous distention. No thyroid enlargement, no tenderness.  LUNGS: Slightly diminished bibasal  breath sounds with bibasal rales.   CARDIOVASCULAR: Regular rate and rhythm, S1, S2 normal. No murmurs, rubs, or gallops.  ABDOMEN: Soft, nondistended, nontender. Bowel sounds present. No organomegaly or mass.  EXTREMITIES: 2-3+ bilateral lower extremity edema with no cyanosis, or clubbing.  NEUROLOGIC: Cranial nerves II through XII are intact. Muscle strength 5/5 in all extremities. Sensation intact. Gait not checked.  PSYCHIATRIC: The patient is alert and oriented x 3.  Normal affect and good eye contact. SKIN: No obvious rash, lesion, or ulcer.   LABORATORY PANEL:   CBC Recent Labs  Lab 01/08/21 0229  WBC 11.7*  HGB 14.3  HCT 41.5  PLT 162   ------------------------------------------------------------------------------------------------------------------  Chemistries  Recent Labs  Lab 01/08/21 0229  NA 129*  K 5.3*  CL 89*  CO2 23  GLUCOSE 236*  BUN 52*  CREATININE 1.03*  CALCIUM 9.5   ------------------------------------------------------------------------------------------------------------------  Cardiac Enzymes No results for input(s): TROPONINI in the last 168 hours. ------------------------------------------------------------------------------------------------------------------  RADIOLOGY:  DG Chest Portable 1 View  Result  Date: 01/08/2021 CLINICAL DATA:  Shortness of breath EXAM: PORTABLE CHEST 1 VIEW COMPARISON:  None. FINDINGS: Small moderate bilateral pleural effusions with basilar airspace disease. Cardiomegaly with aortic atherosclerosis. No pneumothorax. IMPRESSION: Small to moderate bilateral pleural effusions with basilar airspace disease, atelectasis versus pneumonia. Cardiomegaly Electronically Signed   By: Jasmine Pang M.D.   On: 01/08/2021 02:56      IMPRESSION AND PLAN:  Active Problems:   Acute CHF (congestive heart failure) (HCC)  1.  Acute on chronic systolic CHF with elevated troponin I likely secondary to demand ischemia. -The patient  will be admitted to a stepdown unit bed. -We will continue diuresis with IV Lasix to replace Bumex for now. -We will follow serial troponin I's. -2D echo and a cardiology consult will be obtained. -I notified Dr. Lady Gary about the patient.  2.  Atrial fibrillation with RVR, of new onset. -We will continue her on IV amiodarone drip. -She is currently in normal sinus rhythm. -We will place her on p.o. Eliquis given her CHA2DS2-VASc score of 5. -We will continue Coreg. -We will follow serial troponin I's. -Cardiology consult and 2D echo will be obtained as mentioned above.  3.  Essential hypertension. -We will continue Diovan.  4.  Type 2 diabetes mellitus. -We will continue Jardiance and place the patient on supplement coverage with NovoLog.  5.  Vitamin D deficiency. -We will continue her vitamin D.  DVT prophylaxis: The patient will be on Eliquis. Code Status: full code. Family Communication:  The plan of care was discussed in details with the patient (and family). I answered all questions. The patient agreed to proceed with the above mentioned plan. Further management will depend upon hospital course. Disposition Plan: Back to previous home environment Consults called: Cardiology consult as above All the records are reviewed and case discussed with ED provider.  Status is: Inpatient  Remains inpatient appropriate because:Altered mental status, Ongoing diagnostic testing needed not appropriate for outpatient work up, Unsafe d/c plan, IV treatments appropriate due to intensity of illness or inability to take PO and Inpatient level of care appropriate due to severity of illness   Dispo: The patient is from: Home              Anticipated d/c is to: Home              Patient currently is not medically stable to d/c.   Difficult to place patient No   TOTAL TIME TAKING CARE OF THIS PATIENT: 60 minutes.    Hannah Beat M.D on 01/08/2021 at 4:25 AM  [facility]   From 7 PM-7  AM, contact night-coverage [email or web address]  CC: Primary care physician; Zettie Pho, MD

## 2021-01-08 NOTE — ED Notes (Signed)
Pt placed on 2L [place] by Don Perking MD. Pt reporting that she needs to pee. Purewick, chux, and brief palced under pt by this RN and Data processing manager.

## 2021-01-08 NOTE — ED Notes (Signed)
Zoll pads placed on patient.  

## 2021-01-08 NOTE — ED Triage Notes (Signed)
Pt to ED via [facility] for weakness. Family informed EMS that pt has been acting differently. Pt alert and oriented x3 at this time. Pt appears pale. EMS reports doctors have been fluctuating her Lasix dose recently. Bilateral pitting edema in lower legs present.

## 2021-01-08 NOTE — ED Provider Notes (Addendum)
[facility] Emergency Department Provider Note  ____________________________________________  Time seen: Approximately 3:14 AM  I have reviewed the triage vital signs and the nursing notes.   HISTORY  Chief Complaint Weakness  Level 5 caveat:  Portions of the history and physical were unable to be obtained due to AMS   HPI Jaclyn Meadows is a 80 y.o. female with a history of CHF the EF of 20%, CAD, hypertension who presents from home for altered mental status.  According to the son patient's mental status has been declining over the last 2 days.  This evening she was very altered with generalized weakness.  According to the son, her doctors have been changing her Lasix dosage due to CHF exacerbation.  Patient is altered but able to respond to simple questions.  She denies chest pain or shortness of breath.  She is complaining of feeling very thirsty with dry lips.  She has had no fevers at home   Past Medical History:  Diagnosis Date  . CAD (coronary artery disease)   . Hypertension     Allergies Buckwheat, Flaxseed (linseed), Okra, Other, Strawberry extract, Epinephrine, Neomycin, Sulfur dioxide, Azithromycin, Erythromycin, and Amoxicillin  No family history on file.  Social History  Smoking - no Alcohol - no Drugs - no  Review of Systems  Constitutional: Negative for fever. + AMS Eyes: Negative for visual changes. ENT: Negative for sore throat. Neck: No neck pain  Cardiovascular: Negative for chest pain. Respiratory: Negative for shortness of breath. Gastrointestinal: Negative for abdominal pain, vomiting or diarrhea. Genitourinary: Negative for dysuria. Musculoskeletal: Negative for back pain. Skin: Negative for rash. Neurological: Negative for headaches, weakness or numbness. Psych: No SI or HI  ____________________________________________   PHYSICAL EXAM:  VITAL SIGNS: ED Triage Vitals [01/08/21 0223]  Enc Vitals Group     BP  118/81     Pulse Rate (!) 124     Resp 20     Temp 97.7 F (36.5 C)     Temp Source Oral     SpO2 98 %     Weight 120 lb (54.4 kg)     Height 5\' 2"  (1.575 m)     Head Circumference      Peak Flow      Pain Score 0     Pain Loc      Pain Edu?      Excl. in GC?     Constitutional: Patient is ill-appearing, pale, dry lips, very drowsy but arousable, protecting her airway, able to follow simple command HEENT:      Head: Normocephalic and atraumatic.         Eyes: Conjunctivae are normal. Sclera is non-icteric.       Mouth/Throat: Mucous membranes are dry.       Neck: Supple with no signs of meningismus. Cardiovascular: Irregularly irregular rhythm with tachycardic rate.  Elevated JVD to the earlobe Respiratory: Hypoxic to 88% on room air, crackles bilaterally with diminished breath sounds on bilateral bases Gastrointestinal: Soft, non tender. Musculoskeletal: 3+ pitting edema bilaterally  neurologic: Normal speech and language. Face is symmetric. Moving all extremities. No gross focal neurologic deficits are appreciated. Skin: Skin is warm, dry and intact. No rash noted. Psychiatric: Mood and affect are normal. Speech and behavior are normal.  ____________________________________________   LABS (all labs ordered are listed, but only abnormal results are displayed)  Labs Reviewed  CBC WITH DIFFERENTIAL/PLATELET - Abnormal; Notable for the following components:  Result Value   WBC 11.7 (*)    Neutro Abs 10.4 (*)    Lymphs Abs 0.6 (*)    All other components within normal limits  BASIC METABOLIC PANEL - Abnormal; Notable for the following components:   Sodium 129 (*)    Potassium 5.3 (*)    Chloride 89 (*)    Glucose, Bld 236 (*)    BUN 52 (*)    Creatinine, Ser 1.03 (*)    GFR, Estimated 56 (*)    Anion gap 17 (*)    All other components within normal limits  BRAIN NATRIURETIC PEPTIDE - Abnormal; Notable for the following components:   B Natriuretic Peptide 3,916.8  (*)    All other components within normal limits  TROPONIN I (HIGH SENSITIVITY) - Abnormal; Notable for the following components:   Troponin I (High Sensitivity) 78 (*)    All other components within normal limits  RESP PANEL BY RT-PCR (FLU A&B, COVID) [id number]   ____________________________________________  EKG  ED ECG REPORT I, Nita Sickle, the attending physician, personally viewed and interpreted this ECG.  Atrial fibrillation, rate of 160, borderline prolonged QTC, deep T wave inversions in lateral leads with no ST elevation.  No prior for comparison   03:32 -normal sinus rhythm with a rate of 85, normal intervals, T wave inversions in anterior lateral leads with no ST elevation ____________________________________________  RADIOLOGY  I have personally reviewed the images performed during this visit and I agree with the Radiologist's read.   Interpretation by Radiologist:  DG Chest Portable 1 View  Result Date: 01/08/2021 CLINICAL DATA:  Shortness of breath EXAM: PORTABLE CHEST 1 VIEW COMPARISON:  None. FINDINGS: Small moderate bilateral pleural effusions with basilar airspace disease. Cardiomegaly with aortic atherosclerosis. No pneumothorax. IMPRESSION: Small to moderate bilateral pleural effusions with basilar airspace disease, atelectasis versus pneumonia. Cardiomegaly Electronically Signed   By: Jasmine Pang M.D.   On: 01/08/2021 02:56     ____________________________________________   PROCEDURES  Procedure(s) performed:yes .1-3 Lead EKG Interpretation Performed by: Nita Sickle, MD Authorized by: Nita Sickle, MD     Interpretation: abnormal     ECG rate assessment: tachycardic     Rhythm: atrial fibrillation     Ectopy: PVCs     Conduction: abnormal     Critical Care performed: yes  CRITICAL CARE Performed by: Nita Sickle  ?  Total critical care time: 45 min  Critical care time was exclusive of separately billable procedures  and treating other patients.  Critical care was necessary to treat or prevent imminent or life-threatening deterioration.  Critical care was time spent personally by me on the following activities: development of treatment plan with patient and/or surrogate as well as nursing, discussions with consultants, evaluation of patient's response to treatment, examination of patient, obtaining history from patient or surrogate, ordering and performing treatments and interventions, ordering and review of laboratory studies, ordering and review of radiographic studies, pulse oximetry and re-evaluation of patient's condition.  ____________________________________________   INITIAL IMPRESSION / ASSESSMENT AND PLAN / ED COURSE  80 y.o. female with a history of CHF the EF of 20%, CAD, hypertension who presents from home for altered mental status.  Patient arrives in A. fib with RVR which is a new diagnosis, severely volume overloaded and soft blood pressure with systolics in the 120s.  She is very drowsy but arousable, protecting her airway and following commands with no indication for intubation at this time.  She was given 2 mg of IV  magnesium and started on amiodarone bolus and drip.  After receiving amiodarone bolus patient converted into normal sinus rhythm.  Her blood pressure stabilized.  BNP is 3900, chest x-ray showing bilateral pleural effusions and pulmonary edema, visualized by me and confirmed by radiology.  She has had no cough or fever therefore less likely pneumonia.  Blood work with several abnormalities including hyponatremia, elevated BUN, and anion gap, and elevated troponin concerning for demand ischemia.  Her encephalopathy is most likely a combination of elevated BUN, hyponatremia, dehydration, and hypoxia.  History is gathered from her and her son who is at bedside.  Son confirms the patient is a full code.  Discussed with the hospitalist for admission.  Will start patient on IV Lasix for  diuresis.  Patient on telemetry for close monitoring.  She was started on 2 L oxygen for hypoxia with sats in the mid 80s.  Old medical records reviewed including notes from her cardiologist to [facility]       _____________________________________________ Please note:  Patient was evaluated in Emergency Department today for the symptoms described in the history of present illness. Patient was evaluated in the context of the global COVID-19 pandemic, which necessitated consideration that the patient might be at risk for infection with the SARS-CoV-2 virus that causes COVID-19. Institutional protocols and algorithms that pertain to the evaluation of patients at risk for COVID-19 are in a state of rapid change based on information released by regulatory bodies including the CDC and federal and state organizations. These policies and algorithms were followed during the patient's care in the ED.  Some ED evaluations and interventions may be delayed as a result of limited staffing during the pandemic.   [place] Controlled Substance Database was reviewed by me. ____________________________________________   FINAL CLINICAL IMPRESSION(S) / ED DIAGNOSES   Final diagnoses:  Atrial fibrillation with RVR (HCC)  Acute on chronic systolic congestive heart failure (HCC)  Acute respiratory failure with hypoxia (HCC)  Demand ischemia (HCC)  Hyponatremia  Elevated BUN      NEW MEDICATIONS STARTED DURING THIS VISIT:  ED Discharge Orders    None       Note:  This document was prepared using Dragon voice recognition software and may include unintentional dictation errors.    Don Perking, [place], MD 01/08/21 [id number]    Nita Sickle, MD 01/08/21 [phone]

## 2021-01-08 NOTE — Progress Notes (Signed)
This RN attempted to give AM PO meds. Pt immediately falls asleep after arousal. MD aware.

## 2021-01-08 NOTE — Progress Notes (Signed)
PHARMACIST - PHYSICIAN ORDER COMMUNICATION  CONCERNING: P&T Medication Policy on Herbal Medications  DESCRIPTION:  This patient's order for:  astaxanthin  has been noted.  This product(s) is classified as an "herbal" or natural product. Due to a lack of definitive safety studies or FDA approval, nonstandard manufacturing practices, plus the potential risk of unknown drug-drug interactions while on inpatient medications, the Pharmacy and Therapeutics Committee does not permit the use of "herbal" or natural products of this type within [facility].   ACTION TAKEN: The pharmacy department is unable to verify this order at this time. Please reevaluate patient's clinical condition at discharge and address if the herbal or natural product(s) should be resumed at that time.  Otelia Sergeant, PharmD, [facility] 01/08/2021 4:26 AM

## 2021-01-08 NOTE — Progress Notes (Signed)
[name], son, is with the patient

## 2021-01-08 NOTE — ED Notes (Signed)
Code cart at bedside - pads on pt

## 2021-01-09 ENCOUNTER — Inpatient Hospital Stay: Payer: Self-pay

## 2021-01-09 ENCOUNTER — Inpatient Hospital Stay: Payer: Medicare Other

## 2021-01-09 ENCOUNTER — Encounter: Payer: Self-pay | Admitting: Family Medicine

## 2021-01-09 DIAGNOSIS — I639 Cerebral infarction, unspecified: Secondary | ICD-10-CM

## 2021-01-09 DIAGNOSIS — I5021 Acute systolic (congestive) heart failure: Secondary | ICD-10-CM | POA: Diagnosis not present

## 2021-01-09 LAB — URINALYSIS, ROUTINE W REFLEX MICROSCOPIC
Bilirubin Urine: NEGATIVE
Glucose, UA: 50 mg/dL — AB
Ketones, ur: 5 mg/dL — AB
Leukocytes,Ua: NEGATIVE
Nitrite: NEGATIVE
Protein, ur: 30 mg/dL — AB
RBC / HPF: >50 RBC/hpf — ABNORMAL HIGH (ref 0–5)
Specific Gravity, Urine: 1.017 (ref 1.005–1.030)
pH: 5 (ref 5.0–8.0)

## 2021-01-09 LAB — HEPARIN LEVEL (UNFRACTIONATED): Heparin Unfractionated: 1.05 IU/mL — ABNORMAL HIGH (ref 0.30–0.70)

## 2021-01-09 LAB — CBC WITH DIFFERENTIAL/PLATELET
Abs Immature Granulocytes: 0.09 10*3/uL — ABNORMAL HIGH (ref 0.00–0.07)
Basophils Absolute: 0 10*3/uL (ref 0.0–0.1)
Basophils Relative: 0 %
Eosinophils Absolute: 0 10*3/uL (ref 0.0–0.5)
Eosinophils Relative: 0 %
HCT: 48.1 % — ABNORMAL HIGH (ref 36.0–46.0)
Hemoglobin: 16.9 g/dL — ABNORMAL HIGH (ref 12.0–15.0)
Immature Granulocytes: 1 %
Lymphocytes Relative: 4 %
Lymphs Abs: 0.7 10*3/uL (ref 0.7–4.0)
MCH: 31.8 pg (ref 26.0–34.0)
MCHC: 35.1 g/dL (ref 30.0–36.0)
MCV: 90.4 fL (ref 80.0–100.0)
Monocytes Absolute: 1.6 10*3/uL — ABNORMAL HIGH (ref 0.1–1.0)
Monocytes Relative: 9 %
Neutro Abs: 15.5 10*3/uL — ABNORMAL HIGH (ref 1.7–7.7)
Neutrophils Relative %: 86 %
Platelets: 143 10*3/uL — ABNORMAL LOW (ref 150–400)
RBC: 5.32 MIL/uL — ABNORMAL HIGH (ref 3.87–5.11)
RDW: 15.5 % (ref 11.5–15.5)
WBC: 17.9 10*3/uL — ABNORMAL HIGH (ref 4.0–10.5)
nRBC: 0 % (ref 0.0–0.2)

## 2021-01-09 LAB — BRAIN NATRIURETIC PEPTIDE: B Natriuretic Peptide: >4500 pg/mL — ABNORMAL HIGH (ref 0.0–100.0)

## 2021-01-09 LAB — PHOSPHORUS: Phosphorus: 5.1 mg/dL — ABNORMAL HIGH (ref 2.5–4.6)

## 2021-01-09 LAB — BASIC METABOLIC PANEL
Anion gap: 19 — ABNORMAL HIGH (ref 5–15)
BUN: 61 mg/dL — ABNORMAL HIGH (ref 8–23)
CO2: 20 mmol/L — ABNORMAL LOW (ref 22–32)
Calcium: 10.1 mg/dL (ref 8.9–10.3)
Chloride: 90 mmol/L — ABNORMAL LOW (ref 98–111)
Creatinine, Ser: 1.34 mg/dL — ABNORMAL HIGH (ref 0.44–1.00)
GFR, Estimated: 41 mL/min — ABNORMAL LOW (ref >60)
Glucose, Bld: 72 mg/dL (ref 70–99)
Potassium: 5.4 mmol/L — ABNORMAL HIGH (ref 3.5–5.1)
Sodium: 129 mmol/L — ABNORMAL LOW (ref 135–145)

## 2021-01-09 LAB — AMMONIA: Ammonia: 27 umol/L (ref 9–35)

## 2021-01-09 LAB — PROCALCITONIN: Procalcitonin: 0.26 ng/mL

## 2021-01-09 LAB — APTT: aPTT: 35 seconds (ref 24–36)

## 2021-01-09 LAB — PROTIME-INR
INR: 2.4 — ABNORMAL HIGH (ref 0.8–1.2)
Prothrombin Time: 25.1 seconds — ABNORMAL HIGH (ref 11.4–15.2)

## 2021-01-09 LAB — LACTIC ACID, PLASMA
Lactic Acid, Venous: 6.3 mmol/L (ref 0.5–1.9)
Lactic Acid, Venous: 4.4 mmol/L (ref 0.5–1.9)

## 2021-01-09 LAB — GLUCOSE, CAPILLARY
Glucose-Capillary: 84 mg/dL (ref 70–99)
Glucose-Capillary: 83 mg/dL (ref 70–99)
Glucose-Capillary: 87 mg/dL (ref 70–99)
Glucose-Capillary: 105 mg/dL — ABNORMAL HIGH (ref 70–99)

## 2021-01-09 LAB — TSH: TSH: 4.58 u[IU]/mL — ABNORMAL HIGH (ref 0.350–4.500)

## 2021-01-09 LAB — VITAMIN B12: Vitamin B-12: >7500 pg/mL — ABNORMAL HIGH (ref 180–914)

## 2021-01-09 LAB — MAGNESIUM: Magnesium: 2.6 mg/dL — ABNORMAL HIGH (ref 1.7–2.4)

## 2021-01-09 MED ORDER — INSULIN ASPART 100 UNIT/ML ~~LOC~~ SOLN
0.0000 [IU] | SUBCUTANEOUS | Status: DC | PRN
  Administered 2021-01-10 (×2): 2 [IU] via SUBCUTANEOUS
  Filled 2021-01-09 (×2): qty 1

## 2021-01-09 MED ORDER — VANCOMYCIN HCL 1000 MG/200ML IV SOLN: 1000.0000 mg | INTRAVENOUS | Status: DC

## 2021-01-09 MED ORDER — OSMOLITE 1.2 CAL PO LIQD: 1000.0000 mL | ORAL | Status: DC

## 2021-01-09 MED ORDER — SODIUM CHLORIDE 0.9 % IV SOLN
2.0000 g | INTRAVENOUS | Status: DC
  Administered 2021-01-10: 2 g via INTRAVENOUS
  Filled 2021-01-09: qty 2

## 2021-01-09 MED ORDER — VITAL AF 1.2 CAL PO LIQD
1000.0000 mL | ORAL | Status: DC
  Administered 2021-01-10: 1000 mL

## 2021-01-09 MED ORDER — STROKE: EARLY STAGES OF RECOVERY BOOK: Freq: Once | Status: AC

## 2021-01-09 MED ORDER — VANCOMYCIN HCL 1250 MG/250ML IV SOLN
1250.0000 mg | Freq: Once | INTRAVENOUS | Status: AC
  Administered 2021-01-09: 1250 mg via INTRAVENOUS
  Filled 2021-01-09: qty 250

## 2021-01-09 MED ORDER — VANCOMYCIN HCL 1000 MG/200ML IV SOLN
1000.0000 mg | Freq: Once | INTRAVENOUS | Status: DC
  Filled 2021-01-09: qty 200

## 2021-01-09 MED ORDER — HEPARIN BOLUS VIA INFUSION
2500.0000 [IU] | Freq: Once | INTRAVENOUS | Status: AC
  Administered 2021-01-09: 2500 [IU] via INTRAVENOUS
  Filled 2021-01-09: qty 2500

## 2021-01-09 MED ORDER — SODIUM CHLORIDE 0.9 % IV SOLN
2.0000 g | Freq: Once | INTRAVENOUS | Status: AC
  Administered 2021-01-09: 2 g via INTRAVENOUS
  Filled 2021-01-09: qty 2

## 2021-01-09 MED ORDER — FAMOTIDINE IN NACL 20-0.9 MG/50ML-% IV SOLN
20.0000 mg | INTRAVENOUS | Status: DC
  Administered 2021-01-10: 20 mg via INTRAVENOUS
  Filled 2021-01-09: qty 50

## 2021-01-09 MED ORDER — HEPARIN (PORCINE) 25000 UT/250ML-% IV SOLN
750.0000 [IU]/h | INTRAVENOUS | Status: DC
  Administered 2021-01-09: 750 [IU]/h via INTRAVENOUS
  Filled 2021-01-09: qty 250

## 2021-01-09 MED ORDER — METRONIDAZOLE IN NACL 5-0.79 MG/ML-% IV SOLN
500.0000 mg | Freq: Three times a day (TID) | INTRAVENOUS | Status: DC
  Administered 2021-01-09 – 2021-01-10 (×3): 500 mg via INTRAVENOUS
  Filled 2021-01-09 (×6): qty 100

## 2021-01-09 NOTE — Progress Notes (Signed)
PROGRESS NOTE    Jaclyn Meadows  HER:[id number] DOB: 01-24-1941 DOA: 01/08/2021 PCP: Zettie Pho, MD    Brief Narrative:  80 year old female with history of coronary artery disease, chronic systolic heart failure with known ejection fraction less than 20%, hypertension presented to the emergency room with acute onset of generalized weakness, confusion that was worsening for last few days.  Also reportedly having progressive dyspnea and lower extremity edema.  In the emergency room, she was found to be with A. fib RVR, respiratory 20.  BNP 3916.  WBC 11.7.  Covid and influenza negative.  EKG showed rapid A. fib.  Chest x-ray showed small to moderate bilateral pleural effusion and cardiomegaly.  Started on amiodarone drip and admitted to stepdown unit.   Assessment & Plan:   Active Problems:   Acute CHF (congestive heart failure) (HCC)  Acute on chronic systolic congestive heart failure: Patient with significant and severe congestive heart failure with known ejection fraction less than 20%.  Repeat echocardiogram pending.  Currently remains on high-dose IV diuresis.  Urine output is not recorded properly.  Paroxysmal A. fib with RVR: Loaded with amiodarone and now remains on maintenance amiodarone.  Not taking adequate oral medications.  We will keep on amiodarone maintenance doses. Not able to take by mouth, Eliquis is stopped, heparin started. Followed by cardiology. Recent cardiac cath with no significant coronary artery disease.  Altered mental status/acute metabolic encephalopathy: Patient is persistently lethargic.  Exact cause unknown.  Will rule out infective causes, will order repeat chest x-ray and UA and culture. TSH normal.  Ammonia is normal.  B12 pending. Blood gas analysis is fairly normal 4/9. CT head with no acute findings 4/9.  Will check MRI of the brain today to rule out any acute stroke.  Hypertension: Blood pressure stable on current regimen.  Type 2 diabetes: On  Jardiance at home, currently on sliding scale insulin.  Goal of care: Discussion with patient's son Mr [name] at the bedside.  Updated about current status.  Also updated him about patient's poor recovery, very poor ejection fraction and altered mentation currently with no clear explanation.  Probably due to severe medical conditions. Desires full code but does not want his mother to be on life support continuously if no meaningful recovery. Palliative care consulted to coordinate, help make decisions.   DVT prophylaxis: heparin bolus via infusion 2,500 Units Start: 01/09/21 1300   Code Status: Full code Family Communication: Son at the bedside Disposition Plan: Status is: Inpatient  Remains inpatient appropriate because:Altered mental status and IV treatments appropriate due to intensity of illness or inability to take PO   Dispo: The patient is from: Home              Anticipated d/c is to: Unknown at this time.              Patient currently is not medically stable to d/c.   Difficult to place patient No         Consultants:   Cardiology  Procedures:   None  Antimicrobials:   None   Subjective: Patient seen and examined.  Remains persistently lethargic.  She responds to strong voice, answers few appropriate questions and then goes back to sleep.  No spontaneous eye opening.  Difficulty 1 to follow simple commands.  Objective: Vitals:   01/09/21 1000 01/09/21 1100 01/09/21 1200 01/09/21 1300  BP: 102/62 106/74 100/74 108/67  Pulse: 76 72 71 69  Resp: 20 11 14  (!) 0  Temp:      TempSrc:      SpO2: 99% 100% 100% 100%  Weight:      Height:        Intake/Output Summary (Last 24 hours) at 01/09/2021 1357 Last data filed at 01/08/2021 1500 Gross per 24 hour  Intake 375.64 ml  Output 150 ml  Net 225.64 ml   Filed Weights   01/08/21 0223 01/08/21 0700 01/09/21 0500  Weight: 54.4 kg 53.9 kg 53.1 kg    Examination:  General exam: Sick looking, frail and  debilitated.  On 2 L oxygen.  Mostly lethargic and sleepy. Respiratory system: Bilateral conducted airway sounds.  Posterior basal crackles present. Cardiovascular system: S1 & S2 heard, RRR.  1+ bilateral pedal edema.   Gastrointestinal system: Abdomen is nondistended, soft and nontender. No organomegaly or masses felt. Normal bowel sounds heard. Central nervous system: Sleepy.  Lethargic.  Wakes up to strong conversation and answers few appropriate questions and then goes back to sleep. Spontaneously moves right hand, left hand less movement.   Data Reviewed: I have personally reviewed following labs and imaging studies  CBC: Recent Labs  Lab 01/08/21 0229 01/09/21 0412  WBC 11.7* 17.9*  NEUTROABS 10.4* 15.5*  HGB 14.3 16.9*  HCT 41.5 48.1*  MCV 90.8 90.4  PLT 162 143*   Basic Metabolic Panel: Recent Labs  Lab 01/08/21 0229 01/08/21 0521 01/08/21 0637 01/08/21 1101 01/09/21 0412  NA 129* 129*  --  128* 129*  K 5.3* 6.5*  --  4.8 5.4*  CL 89* 91*  --  88* 90*  CO2 23 17*  --  23 20*  GLUCOSE 236* 265*  --  290* 72  BUN 52* 55*  --  55* 61*  CREATININE 1.03* 1.04*  --  1.04* 1.34*  CALCIUM 9.5 9.7  --  9.8 10.1  MG  --   --  2.8* 2.5* 2.6*  PHOS  --   --   --   --  5.1*   GFR: Estimated Creatinine Clearance: 27.8 mL/min (A) (by C-G formula based on SCr of 1.34 mg/dL (H)). Liver Function Tests: No results for input(s): AST, ALT, ALKPHOS, BILITOT, PROT, ALBUMIN in the last 168 hours. No results for input(s): LIPASE, AMYLASE in the last 168 hours. Recent Labs  Lab 01/09/21 1227  AMMONIA 27   Coagulation Profile: Recent Labs  Lab 01/09/21 1227  INR 2.4*   Cardiac Enzymes: No results for input(s): CKTOTAL, CKMB, CKMBINDEX, TROPONINI in the last 168 hours. BNP (last 3 results) No results for input(s): PROBNP in the last 8760 hours. HbA1C: Recent Labs    01/08/21 0521  HGBA1C 7.2*   CBG: Recent Labs  Lab 01/08/21 1300 01/08/21 1544 01/08/21 2205  01/09/21 0735 01/09/21 1144  GLUCAP 271* 232* 103* 84 83   Lipid Profile: No results for input(s): CHOL, HDL, LDLCALC, TRIG, CHOLHDL, LDLDIRECT in the last 72 hours. Thyroid Function Tests: Recent Labs    01/09/21 0412  TSH 4.580*   Anemia Panel: No results for input(s): VITAMINB12, FOLATE, FERRITIN, TIBC, IRON, RETICCTPCT in the last 72 hours. Sepsis Labs: No results for input(s): PROCALCITON, LATICACIDVEN in the last 168 hours.  Recent Results (from the past 240 hour(s))  Resp Panel by RT-PCR (Flu A&B, Covid) Nasopharyngeal Swab     Status: None   Collection Time: 01/08/21  2:28 AM   Specimen: Nasopharyngeal Swab; Nasopharyngeal(NP) swabs in vial transport medium  Result Value Ref Range Status   SARS Coronavirus 2 by RT PCR  NEGATIVE NEGATIVE Final    Comment: (NOTE) SARS-CoV-2 target nucleic acids are NOT DETECTED.  The SARS-CoV-2 RNA is generally detectable in upper respiratory specimens during the acute phase of infection. The lowest concentration of SARS-CoV-2 viral copies this assay can detect is 138 copies/mL. A negative result does not preclude SARS-Cov-2 infection and should not be used as the sole basis for treatment or other patient management decisions. A negative result may occur with  improper specimen collection/handling, submission of specimen other than nasopharyngeal swab, presence of viral mutation(s) within the areas targeted by this assay, and inadequate number of viral copies(<138 copies/mL). A negative result must be combined with clinical observations, patient history, and epidemiological information. The expected result is Negative.  Fact Sheet for Patients:  BloggerCourse.com  Fact Sheet for Healthcare Providers:  SeriousBroker.it  This test is no t yet approved or cleared by the [place] FDA and  has been authorized for detection and/or diagnosis of SARS-CoV-2 by FDA under an Emergency Use  Authorization (EUA). This EUA will remain  in effect (meaning this test can be used) for the duration of the COVID-19 declaration under Section 564(b)(1) of the Act, 21 U.S.C.section 360bbb-3(b)(1), unless the authorization is terminated  or revoked sooner.       Influenza A by PCR NEGATIVE NEGATIVE Final   Influenza B by PCR NEGATIVE NEGATIVE Final    Comment: (NOTE) The Xpert Xpress SARS-CoV-2/FLU/RSV plus assay is intended as an aid in the diagnosis of influenza from Nasopharyngeal swab specimens and should not be used as a sole basis for treatment. Nasal washings and aspirates are unacceptable for Xpert Xpress SARS-CoV-2/FLU/RSV testing.  Fact Sheet for Patients: BloggerCourse.com  Fact Sheet for Healthcare Providers: SeriousBroker.it  This test is not yet approved or cleared by the [place] FDA and has been authorized for detection and/or diagnosis of SARS-CoV-2 by FDA under an Emergency Use Authorization (EUA). This EUA will remain in effect (meaning this test can be used) for the duration of the COVID-19 declaration under Section 564(b)(1) of the Act, 21 U.S.C. section 360bbb-3(b)(1), unless the authorization is terminated or revoked.  Performed at [facility], [address] [place], [place] [address]   MRSA PCR Screening     Status: None   Collection Time: 01/08/21  4:34 AM   Specimen: Nasal Mucosa; Nasopharyngeal  Result Value Ref Range Status   MRSA by PCR NEGATIVE NEGATIVE Final    Comment:        The GeneXpert MRSA Assay (FDA approved for NASAL specimens only), is one component of a comprehensive MRSA colonization surveillance program. It is not intended to diagnose MRSA infection nor to guide or monitor treatment for MRSA infections. Performed at [facility], [address] [place], [place] [address]          Radiology Studies: CT HEAD WO CONTRAST  Result Date:  01/08/2021 CLINICAL DATA:  Altered mental status. EXAM: CT HEAD WITHOUT CONTRAST TECHNIQUE: Contiguous axial images were obtained from the base of the skull through the vertex without intravenous contrast. COMPARISON:  None. FINDINGS: Brain: Moderately enlarged ventricles and subarachnoid spaces. Moderate patchy white matter low density in both cerebral hemispheres. No intracranial hemorrhage, mass lesion or CT evidence of acute infarction. Vascular: No hyperdense vessel or unexpected calcification. Skull: Normal. Negative for fracture or focal lesion. Sinuses/Orbits: Unremarkable. Other: None. IMPRESSION: 1. No acute abnormality. 2. Moderate diffuse cerebral and cerebellar atrophy. 3. Moderate chronic small vessel white matter ischemic changes in both cerebral hemispheres. Electronically  Signed   By: Beckie Salts M.D.   On: 01/08/2021 16:58   DG Chest Portable 1 View  Result Date: 01/08/2021 CLINICAL DATA:  Shortness of breath EXAM: PORTABLE CHEST 1 VIEW COMPARISON:  None. FINDINGS: Small moderate bilateral pleural effusions with basilar airspace disease. Cardiomegaly with aortic atherosclerosis. No pneumothorax. IMPRESSION: Small to moderate bilateral pleural effusions with basilar airspace disease, atelectasis versus pneumonia. Cardiomegaly Electronically Signed   By: Jasmine Pang M.D.   On: 01/08/2021 02:56   [place] EKG SITE RITE  Result Date: 01/09/2021 If Site Rite image not attached, placement could not be confirmed due to current cardiac rhythm.       Scheduled Meds: . ascorbic acid  500 mg Oral Daily  . B-complex with vitamin C   Oral Daily  . Chlorhexidine Gluconate Cloth  6 each Topical Daily  . cholecalciferol  2,000 Units Oral Daily  . empagliflozin  10 mg Oral Daily  . furosemide  60 mg Intravenous Q12H  . heparin  2,500 Units Intravenous Once  . insulin aspart  0-15 Units Subcutaneous TID PC & HS  . multivitamin with minerals  1 tablet Oral Daily   Continuous Infusions: .  amiodarone 30 mg/hr (01/09/21 1336)  . [START ON 01/10/2021] famotidine (PEPCID) IV    . heparin       LOS: 1 day    Time spent: 35 minutes    Dorcas Carrow, MD [facility] Pager [phone]

## 2021-01-09 NOTE — Progress Notes (Signed)
With patient's persistent encephalopathy, we did additional work-up today.  CT scan of the head 4/9 with chronic changes. MRI of the brain today shows acute infarction posterior limb of the internal capsule on the right and multiple chronic microvascular changes. Ammonia, within normal limit B12, within normal limit Lactic acid 6 Procalcitonin 0.26 TSH normal.  Acute ischemic stroke, right MCA with severe metabolic encephalopathy: Presented 4/8 evening, normal CT scan on 4/9.  Started on Eliquis on 4/8, changed to heparin today. Patient is not a TPA candidate due to already being on anticoagulation. She has severe underlying congestive heart failure with ejection fraction of 20%. With frailty and debility, likely poor recovery. Will discuss with neurology for further recommendations.  Patient currently remains on amiodarone for rate control and heparin for anticoagulation.  No evidence of bleeding.  Lactic acidosis: Lactic acid 6.  Procalcitonin 0.26.  Chest x-ray with fluid overload, possible right lower lobe infiltrate.  Suspect aspiration. Blood cultures, urine cultures to be drawn We will start patient on broad-spectrum antibiotics for next 24 to 48 hours. Patient with ejection fraction 20%, cannot tolerate any IV fluids.  Will stop further diuretics today. Her blood pressures are adequate, currently no indication for vasopressor therapy. We will treat as severe sepsis, unable to resuscitate with fluid.  Goal of care discussion: Met with patient's two sons and daughter-in-law in the meeting room.  We discussed in detail about patient's very advanced frailty, altered mentation and poor chances of recovery, intolerance to therapeutics with severe congestive heart failure.  Updated different pathophysiology, rehab process if patient has to do good clinical recovery. We discussed about CODE STATUS, patient son stated that she wanted to have at least one CPR and if unsuccessful then she  would like to be comfort care.  Currently full code. I discussed with them that given severe medical issues, she even qualifies for hospice referral today.  They want to try antibiotics, medications and NG tube feeding today and see next 24 to 48 hours before deciding comfort care or hospice pathway. If patient does deteriorate further, will need to call patient's son Mr. Ysidro Evert and re-adress the comfort care pathway.

## 2021-01-09 NOTE — Progress Notes (Signed)
Pharmacy Antibiotic Note  Jaclyn Meadows is a 80 y.o. female admitted on 01/08/2021 with unknown source of infection.  Pharmacy has been consulted for Cefepime and Vancomycin dosing.  Plan: Vancomycin 1250mg  IV loading dose followed by Vancomycin 1000 mg IV Q 48 hrs. Goal AUC 400-550. Expected AUC: 475.9 SCr used: 1.34 Expected Cmin: 9.8  Cefepime 2g IV q24h   Height: 5\' 2"  (157.5 cm) Weight: 53.1 kg (117 lb 1 oz) IBW/kg (Calculated) : 50.1  Temp (24hrs), Avg:96.6 F (35.9 C), Min:96.1 F (35.6 C), Max:97.7 F (36.5 C)  Recent Labs  Lab 01/08/21 0229 01/08/21 0521 01/08/21 1101 01/09/21 0412 01/09/21 1456  WBC 11.7*  --   --  17.9*  --   CREATININE 1.03* 1.04* 1.04* 1.34*  --   LATICACIDVEN  --   --   --   --  6.3*    Estimated Creatinine Clearance: 27.8 mL/min (A) (by C-G formula based on SCr of 1.34 mg/dL (H)).    Allergies  Allergen Reactions  . Buckwheat Anaphylaxis  . Flaxseed (Linseed) Anaphylaxis  . Okra Anaphylaxis  . Other Anaphylaxis    Trout   . Strawberry Extract Anaphylaxis  . Epinephrine Other (See Comments)    Prolonged numbness  . Neomycin Rash    Other reaction(s): UNKNOWN  . Sulfur Dioxide Dermatitis    Other reaction(s): UNKNOWN  . Azithromycin     Skin lesions  . Erythromycin   . Amoxicillin     dysuria    Antimicrobials this admission: Cefepime 4/10 >>  Vancomycin 4/10 >>   Dose adjustments this admission:  Microbiology results:  Thank you for allowing pharmacy to be a part of this patient's care.  03/11/21, PharmD, BCPS 01/09/2021 5:49 PM

## 2021-01-09 NOTE — Progress Notes (Signed)
Patient Name: Jaclyn Meadows Date of Encounter: 01/09/2021  Hospital Problem List     Active Problems:   Acute CHF (congestive heart failure) [facility])    Patient Profile     80 y.o. female with history of HFrEf with know EF of 15-20% with insignificant cad by cath in 08/27/20 at [facility] with a 30-40% pros and mid lad stenosis and luminal irreg in the remainder of the lad with a 50% prox D2 with insignificant disease in the circ who has refused asa or statin therapy per note from 01/04/21, has a mildly dilated thoracic aorta who was admitted with progressive weakness and sob. She is scheduled to be on Entresto 49/51 mg bid, coreg 25 mg bid and lasix 20 mg daily. She appears to have been relatively noncompliant with her diuretics and also non compliant with her other meds. She has a mild hsTrop elevation which is flat c/w demand  At 78,77. BNP was 3916.  Last know BNP was 11/21 and it was 277. LDL 11/21 was 79  Was last seen by her primary cardiology office 4/5 and was stressed compliance with meds. CXR shows smal to moderate bilateral pleural effusions with cardiomegaly. EKG showed nsr with incomplete lbbb.   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   Inpatient Medications    . ascorbic acid  500 mg Oral Daily  . B-complex with vitamin C   Oral Daily  . Chlorhexidine Gluconate Cloth  6 each Topical Daily  . cholecalciferol  2,000 Units Oral Daily  . empagliflozin  10 mg Oral Daily  . furosemide  60 mg Intravenous Q12H  . insulin aspart  0-15 Units Subcutaneous TID PC & HS  . multivitamin with minerals  1 tablet Oral Daily    Vital Signs    Vitals:   01/09/21 0300 01/09/21 0400 01/09/21 0500 01/09/21 0600  BP: 110/72 115/73 105/73 (!) 170/138  Pulse: 80 79 79 78  Resp: 17 (!) 0 13 (!) 22  Temp:  (!) 96.1 F (35.6 C)    TempSrc:  Rectal    SpO2: 100% 96% 98% 98%  Weight:   53.1 kg   Height:        Intake/Output Summary  (Last 24 hours) at 01/09/2021 1138 Last data filed at 01/08/2021 1500 Gross per 24 hour  Intake 375.64 ml  Output 250 ml  Net 125.64 ml   Filed Weights   01/08/21 0223 01/08/21 0700 01/09/21 0500  Weight: 54.4 kg 53.9 kg 53.1 kg    Physical Exam    GEN: Chronically ill appearing female.  HEENT: normal.  Neck: Supple, no JVD, carotid bruits, or masses. Cardiac: RRR, no murmurs, rubs, or gallops. No clubbing, cyanosis, edema.  Radials/DP/PT 2+ and equal bilaterally.  Respiratory:  Respirations regular and unlabored, clear to auscultation bilaterally. GI: Soft, nontender, nondistended, BS + x 4. MS: no deformity or atrophy. Skin: warm and dry, no rash. Neuro:  Lethargic and minimally responsive.   Labs    CBC Recent Labs    01/08/21 0229 01/09/21 0412  WBC 11.7* 17.9*  NEUTROABS 10.4* 15.5*  HGB 14.3 16.9*  HCT 41.5 48.1*  MCV 90.8 90.4  PLT 162 143*   Basic Metabolic Panel Recent Labs    62/95/28 1101 01/09/21 0412  NA 128* 129*  K 4.8 5.4*  CL 88* 90*  CO2 23 20*  GLUCOSE 290* 72  BUN 55* 61*  CREATININE 1.04* 1.34*  CALCIUM 9.8 10.1  MG 2.5* 2.6*  PHOS  --  5.1*   Liver Function Tests No results for input(s): AST, ALT, ALKPHOS, BILITOT, PROT, ALBUMIN in the last 72 hours. No results for input(s): LIPASE, AMYLASE in the last 72 hours. Cardiac Enzymes No results for input(s): CKTOTAL, CKMB, CKMBINDEX, TROPONINI in the last 72 hours. BNP Recent Labs    01/08/21 0235 01/09/21 0412  BNP 3,916.8* >4,500.0*   D-Dimer No results for input(s): DDIMER in the last 72 hours. Hemoglobin A1C Recent Labs    01/08/21 0521  HGBA1C 7.2*   Fasting Lipid Panel No results for input(s): CHOL, HDL, LDLCALC, TRIG, CHOLHDL, LDLDIRECT in the last 72 hours. Thyroid Function Tests Recent Labs    01/08/21 0637  TSH 1.733    Telemetry    nsr  ECG    afib with rvr  Radiology    CT HEAD WO CONTRAST  Result Date: 01/08/2021 CLINICAL DATA:  Altered mental  status. EXAM: CT HEAD WITHOUT CONTRAST TECHNIQUE: Contiguous axial images were obtained from the base of the skull through the vertex without intravenous contrast. COMPARISON:  None. FINDINGS: Brain: Moderately enlarged ventricles and subarachnoid spaces. Moderate patchy white matter low density in both cerebral hemispheres. No intracranial hemorrhage, mass lesion or CT evidence of acute infarction. Vascular: No hyperdense vessel or unexpected calcification. Skull: Normal. Negative for fracture or focal lesion. Sinuses/Orbits: Unremarkable. Other: None. IMPRESSION: 1. No acute abnormality. 2. Moderate diffuse cerebral and cerebellar atrophy. 3. Moderate chronic small vessel white matter ischemic changes in both cerebral hemispheres. Electronically Signed   By: Beckie Salts M.D.   On: 01/08/2021 16:58   DG Chest Portable 1 View  Result Date: 01/08/2021 CLINICAL DATA:  Shortness of breath EXAM: PORTABLE CHEST 1 VIEW COMPARISON:  None. FINDINGS: Small moderate bilateral pleural effusions with basilar airspace disease. Cardiomegaly with aortic atherosclerosis. No pneumothorax. IMPRESSION: Small to moderate bilateral pleural effusions with basilar airspace disease, atelectasis versus pneumonia. Cardiomegaly Electronically Signed   By: Jasmine Pang M.D.   On: 01/08/2021 02:56    Assessment & Plan     1. 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    -TTE 10/05/20 with EF 15-20% -pt has as history of non compliance. States she takes meds but difficult historian -Will continue with careful diuresis following renal function, electrolytes and hemodynamics.  -Not awake enough to take po meds this am. Head CT pending. Very lethergic. Poor prognosis. 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 Not able to take po today. Was in afib with rvr  and now in nsr. Will d/c eliquis and place on heparin due to inability to take po.   3. Mildly dilated thoracic aorta-will follow.   4. Lethargy-somewhat more notable today. Will not take po. Head CT pending per hospitalist.   Signed, Darlin Priestly. Tedford Berg MD 01/09/2021, 11:38 AM  Pager: [phone]

## 2021-01-09 NOTE — Consult Note (Addendum)
Referring Physician: Dr. 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   HPI: Jaclyn Meadows is an 80 y.o. 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 He mental status had apparently been declining over the past 2 days. By Friday evening she was very altered with generalized weakness. Apparently she had also recently had changes made to her Lasix dosage due CHF exacerbation. On arrival to the ED, the patient was altered but able to respond to simple questions. She endorsed feeling very thirsty, but denied CP or SOB at that time. Atrial fibrillation with RVR was noted on her EKG; she was diagnosed with new onset a-fib in the ED. She was also noted to be severely volume overloaded and with soft BPs with systolics in the 120s. She was administered an amiodarone bolus followed by a drip and also was administered IV magnesium. She was also started on heparin drip. EKG returned to NSR after amiodarone was administered. Her BNP was 3900, troponin was elevated and CXR showed pulmonary edema with bilateral pleural effusions. Other metabolic abnormalities were noted, including hyponatremia. TSH and ammonia were normal. MRI brain was obtained for her AMS, revealing an acute right internal capsule ischemic infarction.   MRI brain: - Acute infarct involving most of the posterior limb internal capsule on the right.  - Mild white matter changes in the periventricular deep white matter bilaterally. No hemorrhage or mass.  - Mild ventricular enlargement due to atrophy   Past Medical History:  Diagnosis Date  . CAD (coronary artery disease)   . Hypertension   Other PMHx as per HPI.   No family history on file. Social History:  has no history on file for tobacco use, alcohol use, and drug use.  Allergies:  Allergies  Allergen Reactions  . Buckwheat Anaphylaxis  . Flaxseed (Linseed)  Anaphylaxis  . Okra Anaphylaxis  . Other Anaphylaxis    Trout   . Strawberry Extract Anaphylaxis  . Epinephrine Other (See Comments)    Prolonged numbness  . Neomycin Rash    Other reaction(s): UNKNOWN  . Sulfur Dioxide Dermatitis    Other reaction(s): UNKNOWN  . Azithromycin     Skin lesions  . Erythromycin   . Amoxicillin     dysuria    Medications:  No current facility-administered medications on file prior to encounter.   Current Outpatient Medications on File Prior to Encounter  Medication Sig Dispense Refill  . Ascorbic Acid (VITAMIN C CR) 1000 MG TBCR Take by mouth.    . Astaxanthin 4 MG CAPS Take 1 tablet by mouth daily.    . B Complex Vitamins (VITAMIN B COMPLEX PO) Take 1 tablet by mouth daily.    . Cholecalciferol 50 MCG (2000 UT) CAPS Take by mouth.    . Digestive Enzymes (ENZYME DIGEST) CAPS Take by mouth.    . furosemide (LASIX) 20 MG tablet Take by mouth.    . L-Lysine 1000 MG TABS Take by mouth.    . Multiple Vitamin (DAILY VALUE MULTIVITAMIN) TABS Take by mouth.    . telmisartan (MICARDIS) 20 MG tablet Take 10 mg by mouth daily.    . bumetanide (BUMEX) 0.5 MG tablet Take 0.5 mg by mouth daily.    . carvedilol (COREG) 25 MG tablet Take 25 mg by mouth 2 (two) times daily. (Patient not taking: No sig reported)    .  ENTRESTO 49-51 MG Take 1 tablet by mouth 2 (two) times daily. (Patient not taking: No sig reported)    . JARDIANCE 10 MG TABS tablet Take 10 mg by mouth daily.    . metoprolol succinate (TOPROL-XL) 100 MG 24 hr tablet Take 100 mg by mouth daily. (Patient not taking: No sig reported)    . spironolactone (ALDACTONE) 25 MG tablet Take 1 tablet by mouth daily.    . valsartan (DIOVAN) 40 MG tablet Take 40 mg by mouth daily.      Scheduled: .  stroke: mapping our early stages of recovery book   Does not apply Once  . ascorbic acid  500 mg Oral Daily  . B-complex with vitamin C   Oral Daily  . Chlorhexidine Gluconate Cloth  6 each Topical Daily  .  cholecalciferol  2,000 Units Oral Daily  . empagliflozin  10 mg Oral Daily  . multivitamin with minerals  1 tablet Oral Daily   Continuous: . amiodarone 30 mg/hr (01/09/21 1900)  . [START ON 01/10/2021] ceFEPime (MAXIPIME) IV    . [START ON 01/10/2021] famotidine (PEPCID) IV    . feeding supplement (VITAL AF 1.2 CAL)    . heparin 750 Units/hr (01/09/21 1900)  . metronidazole 100 mL/hr at 01/09/21 1900  . [START ON 01/11/2021] vancomycin    . vancomycin 1,250 mg (01/09/21 2041)    ROS: Unable to obtain due to somnolence and severe dysarthria.    Physical Examination: Blood pressure 101/66, pulse 70, temperature 97.7 F (36.5 C), temperature source Axillary, resp. rate 14, height 5\' 2"  (1.575 m), weight 53.1 kg, SpO2 97 %.  HEENT: [place]/AT Lungs: Respirations unlabored Ext: Warm and well-perfused.   Neurologic Examination: Mental Status: Somnolent but arousable to tactile and loud voice stimulation. Speech is severely dysarthric. Increased latencies of motor and verbal responses. Gives one-word answers to questions; unable to test for fluency. Follows simple commands. Attention is poor and was unable to participate in naming of objects. Keeps eyes mostly closed unless constantly stimulated while asked to open them. Oriented to city, state, year and month, but not to day.  Cranial Nerves: II:  Unreliable blink to threat. Points to the right slowly when asked if she can see wiggling fingers in her right temporal visual field. Does not respond to left sided visual stimulation. PERRL. III,IV, VI: Unable to assess for ptosis, but seems to have more difficulty opening right eye than her left. Able to gaze to the right and left with hypometric saccades, crossing the midline, but with significantly more difficulty gazing to the left with eyes rapidly drifting back to slightly right of midline. Does not track a moving object to command. No nystagmus.  V,VII: Blinks to eyelid stimulation bilaterally.  Left facial droop.  VIII: Hearing intact to loud voice IX,X: Can vocalize with one-word answers XI: Head tends to be rotated to the right.  XII: Lingual dysarthria is noted.  Motor/Sensory: RUE: Will squeeze examiner's hand with 4/5 strength and localize purposefully to noxious stimulation with antigravity movement.  RLE: Will elevate leg to command and withdraw to noxious.  LUE: Flaccid tone with no movement to any stimuli. Drops to bed immediately after being passively elevated and released.  LLE: Weak withdrawal to noxious. Attempts to elevate antigravity to command, but unable to lift completely off bed.  Deep Tendon Reflexes:  1+ bilateral brachioradialis. Trace bilateral patellar reflexes. 0 bilateral achilles.  Plantars: Right: downgoing   Left: upgoing Cerebellar: Does not follow commands for assessment.  Gait: Deferred   Results for orders placed or performed during the hospital encounter of 01/08/21 (from the past 48 hour(s))  Resp Panel by RT-PCR (Flu A&B, Covid) Nasopharyngeal Swab     Status: None   Collection Time: 01/08/21  2:28 AM   Specimen: Nasopharyngeal Swab; Nasopharyngeal(NP) swabs in vial transport medium  Result Value Ref Range   SARS Coronavirus 2 by RT PCR NEGATIVE NEGATIVE    Comment: (NOTE) SARS-CoV-2 target nucleic acids are NOT DETECTED.  The SARS-CoV-2 RNA is generally detectable in upper respiratory specimens during the acute phase of infection. The lowest concentration of SARS-CoV-2 viral copies this assay can detect is 138 copies/mL. A negative result does not preclude SARS-Cov-2 infection and should not be used as the sole basis for treatment or other patient management decisions. A negative result may occur with  improper specimen collection/handling, submission of specimen other than nasopharyngeal swab, presence of viral mutation(s) within the areas targeted by this assay, and inadequate number of viral copies(<138 copies/mL). A negative result  must be combined with clinical observations, patient history, and epidemiological information. The expected result is Negative.  Fact Sheet for Patients:  BloggerCourse.com  Fact Sheet for Healthcare Providers:  SeriousBroker.it  This test is no t yet approved or cleared by the [place] FDA and  has been authorized for detection and/or diagnosis of SARS-CoV-2 by FDA under an Emergency Use Authorization (EUA). This EUA will remain  in effect (meaning this test can be used) for the duration of the COVID-19 declaration under Section 564(b)(1) of the Act, 21 U.S.C.section 360bbb-3(b)(1), unless the authorization is terminated  or revoked sooner.       Influenza A by PCR NEGATIVE NEGATIVE   Influenza B by PCR NEGATIVE NEGATIVE    Comment: (NOTE) The Xpert Xpress SARS-CoV-2/FLU/RSV plus assay is intended as an aid in the diagnosis of influenza from Nasopharyngeal swab specimens and should not be used as a sole basis for treatment. Nasal washings and aspirates are unacceptable for Xpert Xpress SARS-CoV-2/FLU/RSV testing.  Fact Sheet for Patients: BloggerCourse.com  Fact Sheet for Healthcare Providers: SeriousBroker.it  This test is not yet approved or cleared by the [place] FDA and has been authorized for detection and/or diagnosis of SARS-CoV-2 by FDA under an Emergency Use Authorization (EUA). This EUA will remain in effect (meaning this test can be used) for the duration of the COVID-19 declaration under Section 564(b)(1) of the Act, 21 U.S.C. section 360bbb-3(b)(1), unless the authorization is terminated or revoked.  Performed at [facility], [address] [place], [place] [address]   CBC with Differential     Status: Abnormal   Collection Time: 01/08/21  2:29 AM  Result Value Ref Range   WBC 11.7 (H) 4.0 - 10.5 K/uL   RBC 4.57 3.87 - 5.11 MIL/uL    Hemoglobin 14.3 12.0 - 15.0 g/dL   HCT 19.1 47.8 - 29.5 %   MCV 90.8 80.0 - 100.0 fL   MCH 31.3 26.0 - 34.0 pg   MCHC 34.5 30.0 - 36.0 g/dL   RDW 62.1 30.8 - 65.7 %   Platelets 162 150 - 400 K/uL   nRBC 0.0 0.0 - 0.2 %   Neutrophils Relative % 88 %   Neutro Abs 10.4 (H) 1.7 - 7.7 K/uL   Lymphocytes Relative 5 %   Lymphs Abs 0.6 (L) 0.7 - 4.0 K/uL   Monocytes Relative 6 %   Monocytes Absolute 0.7 0.1 - 1.0 K/uL   Eosinophils Relative 0 %  Eosinophils Absolute 0.0 0.0 - 0.5 K/uL   Basophils Relative 0 %   Basophils Absolute 0.0 0.0 - 0.1 K/uL   Immature Granulocytes 1 %   Abs Immature Granulocytes 0.06 0.00 - 0.07 K/uL    Comment: Performed at [facility], [address] [place], [place] [address]  Basic metabolic panel     Status: Abnormal   Collection Time: 01/08/21  2:29 AM  Result Value Ref Range   Sodium 129 (L) 135 - 145 mmol/L   Potassium 5.3 (H) 3.5 - 5.1 mmol/L   Chloride 89 (L) 98 - 111 mmol/L   CO2 23 22 - 32 mmol/L   Glucose, Bld 236 (H) 70 - 99 mg/dL    Comment: Glucose reference range applies only to samples taken after fasting for at least 8 hours.   BUN 52 (H) 8 - 23 mg/dL   Creatinine, Ser 1.911.03 (H) 0.44 - 1.00 mg/dL   Calcium 9.5 8.9 - 47.810.3 mg/dL   GFR, Estimated 56 (L) >60 mL/min    Comment: (NOTE) Calculated using the CKD-EPI Creatinine Equation (2021)    Anion gap 17 (H) 5 - 15    Comment: Performed at [facility], [address] [place], [place] [address]  Troponin I (High Sensitivity)     Status: Abnormal   Collection Time: 01/08/21  2:29 AM  Result Value Ref Range   Troponin I (High Sensitivity) 78 (H) <18 ng/L    Comment: (NOTE) Elevated high sensitivity troponin I (hsTnI) values and significant  changes across serial measurements may suggest ACS but many other  chronic and acute conditions are known to elevate hsTnI results.  Refer to the "Links" section for chest pain algorithms and additional  guidance. Performed at  [facility], [address] [place], [place] [address]   Brain natriuretic peptide     Status: Abnormal   Collection Time: 01/08/21  2:35 AM  Result Value Ref Range   B Natriuretic Peptide 3,916.8 (H) 0.0 - 100.0 pg/mL    Comment: Performed at [facility], [address] [place], [place] [address]  Glucose, capillary     Status: Abnormal   Collection Time: 01/08/21  4:33 AM  Result Value Ref Range   Glucose-Capillary 245 (H) 70 - 99 mg/dL    Comment: Glucose reference range applies only to samples taken after fasting for at least 8 hours.  MRSA PCR Screening     Status: None   Collection Time: 01/08/21  4:34 AM   Specimen: Nasal Mucosa; Nasopharyngeal  Result Value Ref Range   MRSA by PCR NEGATIVE NEGATIVE    Comment:        The GeneXpert MRSA Assay (FDA approved for NASAL specimens only), is one component of a comprehensive MRSA colonization surveillance program. It is not intended to diagnose MRSA infection nor to guide or monitor treatment for MRSA infections. Performed at [facility], [address] [place], [place] [address]   Basic metabolic panel     Status: Abnormal   Collection Time: 01/08/21  5:21 AM  Result Value Ref Range   Sodium 129 (L) 135 - 145 mmol/L    Comment: QNS TO RERUN LYTES MF   Potassium 6.5 (HH) 3.5 - 5.1 mmol/L    Comment: HEMOLYSIS AT THIS LEVEL MAY AFFECT RESULT CRITICAL RESULT CALLED TO, READ BACK BY AND VERIFIED WITH [facility] AT [id number] AT 01/08/21 MF.    Chloride 91 (L) 98 - 111 mmol/L  Comment: QNS TO RERUN LYTES MF   CO2 17 (L) 22 - 32 mmol/L    Comment: QNS TO RERUN LYTES MF   Glucose, Bld 265 (H) 70 - 99 mg/dL    Comment: Glucose reference range applies only to samples taken after fasting for at least 8 hours.   BUN 55 (H) 8 - 23 mg/dL   Creatinine, Ser 8.29 (H) 0.44 - 1.00 mg/dL   Calcium 9.7 8.9 - 56.2 mg/dL   GFR, Estimated 55 (L) >60 mL/min    Comment: (NOTE) Calculated using the CKD-EPI  Creatinine Equation (2021)    Anion gap 21 (H) 5 - 15    Comment: Performed at [facility], [address] [place], [place] [address]  Hemoglobin A1c     Status: Abnormal   Collection Time: 01/08/21  5:21 AM  Result Value Ref Range   Hgb A1c MFr Bld 7.2 (H) 4.8 - 5.6 %    Comment: (NOTE) Pre diabetes:          5.7%-6.4%  Diabetes:              >6.4%  Glycemic control for   <7.0% adults with diabetes    Mean Plasma Glucose 159.94 mg/dL    Comment: Performed at [facility], [address] [place], [place] [address]  Magnesium     Status: Abnormal   Collection Time: 01/08/21  6:37 AM  Result Value Ref Range   Magnesium 2.8 (H) 1.7 - 2.4 mg/dL    Comment: Performed at [facility], [address] [place], [place] [address]  Troponin I (High Sensitivity)     Status: Abnormal   Collection Time: 01/08/21  6:37 AM  Result Value Ref Range   Troponin I (High Sensitivity) 77 (H) <18 ng/L    Comment: (NOTE) Elevated high sensitivity troponin I (hsTnI) values and significant  changes across serial measurements may suggest ACS but many other  chronic and acute conditions are known to elevate hsTnI results.  Refer to the "Links" section for chest pain algorithms and additional  guidance. Performed at [facility], [address] [place], [place] [address]   TSH     Status: None   Collection Time: 01/08/21  6:37 AM  Result Value Ref Range   TSH 1.733 0.350 - 4.500 uIU/mL    Comment: Performed by a 3rd Generation assay with a functional sensitivity of <=0.01 uIU/mL. Performed at [facility], [address] [place], [place] [address]   Blood gas, venous     Status: Abnormal   Collection Time: 01/08/21  6:37 AM  Result Value Ref Range   pH, Ven 7.41 7.250 - 7.430   pCO2, Ven 33 (L) 44.0 - 60.0 mmHg   pO2, Ven 43.0 32.0 - 45.0 mmHg   Bicarbonate 20.9 20.0 - 28.0 mmol/L   Acid-base deficit 2.8 (H) 0.0 - 2.0 mmol/L   O2 Saturation  79.1 %   Patient temperature 37.0    Sample type VENOUS     Comment: Performed at [facility], [address] [place], [place] [address]  Glucose, capillary     Status: Abnormal   Collection Time: 01/08/21  7:46 AM  Result Value Ref Range   Glucose-Capillary 274 (H) 70 - 99 mg/dL    Comment: Glucose reference range applies only to samples taken after fasting for at least 8 hours.  Glucose, capillary     Status: Abnormal   Collection Time: 01/08/21  8:31  AM  Result Value Ref Range   Glucose-Capillary 314 (H) 70 - 99 mg/dL    Comment: Glucose reference range applies only to samples taken after fasting for at least 8 hours.  Glucose, capillary     Status: Abnormal   Collection Time: 01/08/21  9:39 AM  Result Value Ref Range   Glucose-Capillary 310 (H) 70 - 99 mg/dL    Comment: Glucose reference range applies only to samples taken after fasting for at least 8 hours.  Glucose, capillary     Status: Abnormal   Collection Time: 01/08/21 10:46 AM  Result Value Ref Range   Glucose-Capillary 309 (H) 70 - 99 mg/dL    Comment: Glucose reference range applies only to samples taken after fasting for at least 8 hours.  Basic metabolic panel     Status: Abnormal   Collection Time: 01/08/21 11:01 AM  Result Value Ref Range   Sodium 128 (L) 135 - 145 mmol/L   Potassium 4.8 3.5 - 5.1 mmol/L   Chloride 88 (L) 98 - 111 mmol/L   CO2 23 22 - 32 mmol/L   Glucose, Bld 290 (H) 70 - 99 mg/dL    Comment: Glucose reference range applies only to samples taken after fasting for at least 8 hours.   BUN 55 (H) 8 - 23 mg/dL   Creatinine, Ser 1.61 (H) 0.44 - 1.00 mg/dL   Calcium 9.8 8.9 - 09.6 mg/dL   GFR, Estimated 55 (L) >60 mL/min    Comment: (NOTE) Calculated using the CKD-EPI Creatinine Equation (2021)    Anion gap 17 (H) 5 - 15    Comment: Performed at [facility], [address] [place], [place] [address]  Magnesium     Status: Abnormal   Collection Time: 01/08/21 11:01 AM   Result Value Ref Range   Magnesium 2.5 (H) 1.7 - 2.4 mg/dL    Comment: Performed at [facility], [address] [place], [place] [address]  Glucose, capillary     Status: Abnormal   Collection Time: 01/08/21  1:00 PM  Result Value Ref Range   Glucose-Capillary 271 (H) 70 - 99 mg/dL    Comment: Glucose reference range applies only to samples taken after fasting for at least 8 hours.  Blood gas, arterial     Status: Abnormal   Collection Time: 01/08/21  3:29 PM  Result Value Ref Range   FIO2 0.28    pH, Arterial 7.45 7.350 - 7.450   pCO2 arterial 32 32.0 - 48.0 mmHg   pO2, Arterial 115 (H) 83.0 - 108.0 mmHg   Bicarbonate 22.2 20.0 - 28.0 mmol/L   Acid-base deficit 0.8 0.0 - 2.0 mmol/L   O2 Saturation 98.7 %   Patient temperature 37.0    Collection site RIGHT RADIAL    Sample type ARTERIAL DRAW    Allens test (pass/fail) PASS PASS    Comment: Performed at [facility], [address] [place], [place] [address]  Glucose, capillary     Status: Abnormal   Collection Time: 01/08/21  3:44 PM  Result Value Ref Range   Glucose-Capillary 232 (H) 70 - 99 mg/dL    Comment: Glucose reference range applies only to samples taken after fasting for at least 8 hours.  Glucose, capillary     Status: Abnormal   Collection Time: 01/08/21 10:05 PM  Result Value Ref Range   Glucose-Capillary 103 (H) 70 - 99 mg/dL    Comment: Glucose reference range applies only to samples taken  after fasting for at least 8 hours.  CBC with Differential/Platelet     Status: Abnormal   Collection Time: 01/09/21  4:12 AM  Result Value Ref Range   WBC 17.9 (H) 4.0 - 10.5 K/uL   RBC 5.32 (H) 3.87 - 5.11 MIL/uL   Hemoglobin 16.9 (H) 12.0 - 15.0 g/dL   HCT 91.4 (H) 78.2 - 95.6 %   MCV 90.4 80.0 - 100.0 fL   MCH 31.8 26.0 - 34.0 pg   MCHC 35.1 30.0 - 36.0 g/dL   RDW 21.3 08.6 - 57.8 %   Platelets 143 (L) 150 - 400 K/uL   nRBC 0.0 0.0 - 0.2 %   Neutrophils Relative % 86 %   Neutro Abs 15.5 (H)  1.7 - 7.7 K/uL   Lymphocytes Relative 4 %   Lymphs Abs 0.7 0.7 - 4.0 K/uL   Monocytes Relative 9 %   Monocytes Absolute 1.6 (H) 0.1 - 1.0 K/uL   Eosinophils Relative 0 %   Eosinophils Absolute 0.0 0.0 - 0.5 K/uL   Basophils Relative 0 %   Basophils Absolute 0.0 0.0 - 0.1 K/uL   Immature Granulocytes 1 %   Abs Immature Granulocytes 0.09 (H) 0.00 - 0.07 K/uL    Comment: Performed at [facility], [address] [place], [place] [address]  Magnesium     Status: Abnormal   Collection Time: 01/09/21  4:12 AM  Result Value Ref Range   Magnesium 2.6 (H) 1.7 - 2.4 mg/dL    Comment: Performed at [facility], [address] [place], [place] [address]  Phosphorus     Status: Abnormal   Collection Time: 01/09/21  4:12 AM  Result Value Ref Range   Phosphorus 5.1 (H) 2.5 - 4.6 mg/dL    Comment: Performed at [facility], [address], [place], [place] [address]  Basic metabolic panel     Status: Abnormal   Collection Time: 01/09/21  4:12 AM  Result Value Ref Range   Sodium 129 (L) 135 - 145 mmol/L   Potassium 5.4 (H) 3.5 - 5.1 mmol/L   Chloride 90 (L) 98 - 111 mmol/L   CO2 20 (L) 22 - 32 mmol/L   Glucose, Bld 72 70 - 99 mg/dL    Comment: Glucose reference range applies only to samples taken after fasting for at least 8 hours.   BUN 61 (H) 8 - 23 mg/dL   Creatinine, Ser 0.10 (H) 0.44 - 1.00 mg/dL   Calcium 27.2 8.9 - 53.6 mg/dL   GFR, Estimated 41 (L) >60 mL/min    Comment: (NOTE) Calculated using the CKD-EPI Creatinine Equation (2021)    Anion gap 19 (H) 5 - 15    Comment: Performed at [facility], [address] [place], [place] [address]  Brain natriuretic peptide     Status: Abnormal   Collection Time: 01/09/21  4:12 AM  Result Value Ref Range   B Natriuretic Peptide >4,500.0 (H) 0.0 - 100.0 pg/mL    Comment: Performed at [facility], [address] [place], [place] [address]  TSH     Status: Abnormal   Collection Time:  01/09/21  4:12 AM  Result Value Ref Range   TSH 4.580 (H) 0.350 - 4.500 uIU/mL    Comment: Performed by a 3rd Generation assay with a functional sensitivity of <=0.01 uIU/mL. Performed at [facility], [address] [place], [place] [address]   Glucose, capillary     Status: None  Collection Time: 01/09/21  7:35 AM  Result Value Ref Range   Glucose-Capillary 84 70 - 99 mg/dL    Comment: Glucose reference range applies only to samples taken after fasting for at least 8 hours.  Glucose, capillary     Status: None   Collection Time: 01/09/21 11:44 AM  Result Value Ref Range   Glucose-Capillary 83 70 - 99 mg/dL    Comment: Glucose reference range applies only to samples taken after fasting for at least 8 hours.  Ammonia     Status: None   Collection Time: 01/09/21 12:27 PM  Result Value Ref Range   Ammonia 27 9 - 35 umol/L    Comment: HEMOLYSIS AT THIS LEVEL MAY AFFECT RESULT Performed at [facility], [address] [place], [place] [address]   APTT     Status: None   Collection Time: 01/09/21 12:27 PM  Result Value Ref Range   aPTT 35 24 - 36 seconds    Comment: Performed at [facility], [address] [place], [place] [address]  Protime-INR     Status: Abnormal   Collection Time: 01/09/21 12:27 PM  Result Value Ref Range   Prothrombin Time 25.1 (H) 11.4 - 15.2 seconds   INR 2.4 (H) 0.8 - 1.2    Comment: (NOTE) INR goal varies based on device and disease states. Performed at [facility], [address] [place], [place] [address]   Heparin level (unfractionated)     Status: Abnormal   Collection Time: 01/09/21 12:27 PM  Result Value Ref Range   Heparin Unfractionated 1.05 (H) 0.30 - 0.70 IU/mL    Comment: (NOTE) If heparin results are below expected values, and patient dosage has  been confirmed, suggest follow up testing of antithrombin III levels. Performed at [facility], [address] [place], [place]  [address]   Procalcitonin - Baseline     Status: None   Collection Time: 01/09/21  2:56 PM  Result Value Ref Range   Procalcitonin 0.26 ng/mL    Comment:        Interpretation: PCT (Procalcitonin) <= 0.5 ng/mL: Systemic infection (sepsis) is not likely. Local bacterial infection is possible. (NOTE)       Sepsis PCT Algorithm           Lower Respiratory Tract                                      Infection PCT Algorithm    ----------------------------     ----------------------------         PCT < 0.25 ng/mL                PCT < 0.10 ng/mL          Strongly encourage             Strongly discourage   discontinuation of antibiotics    initiation of antibiotics    ----------------------------     -----------------------------       PCT 0.25 - 0.50 ng/mL            PCT 0.10 - 0.25 ng/mL               OR       >80% decrease in PCT            Discourage initiation of  antibiotics      Encourage discontinuation           of antibiotics    ----------------------------     -----------------------------         PCT >= 0.50 ng/mL              PCT 0.26 - 0.50 ng/mL               AND        <80% decrease in PCT             Encourage initiation of                                             antibiotics       Encourage continuation           of antibiotics    ----------------------------     -----------------------------        PCT >= 0.50 ng/mL                  PCT > 0.50 ng/mL               AND         increase in PCT                  Strongly encourage                                      initiation of antibiotics    Strongly encourage escalation           of antibiotics                                     -----------------------------                                           PCT <= 0.25 ng/mL                                                 OR                                        > 80% decrease in PCT                                       Discontinue / Do not initiate                                             antibiotics  Performed at [facility], [address] [place], [place] [address]   Lactic acid, plasma     Status: Abnormal   Collection  Time: 01/09/21  2:56 PM  Result Value Ref Range   Lactic Acid, Venous 6.3 (HH) 0.5 - 1.9 mmol/L    Comment: CRITICAL RESULT CALLED TO, READ BACK BY AND VERIFIED WITH CHLOE ALLEN @1548  01/09/21 MJU Performed at [facility], [address] [place], [place] [address]   Glucose, capillary     Status: None   Collection Time: 01/09/21  3:38 PM  Result Value Ref Range   Glucose-Capillary 87 70 - 99 mg/dL    Comment: Glucose reference range applies only to samples taken after fasting for at least 8 hours.  Urinalysis, Routine w reflex microscopic Urine, Clean Catch     Status: Abnormal   Collection Time: 01/09/21  4:00 PM  Result Value Ref Range   Color, Urine AMBER (A) YELLOW    Comment: BIOCHEMICALS MAY BE AFFECTED BY COLOR   APPearance CLOUDY (A) CLEAR   Specific Gravity, Urine 1.017 1.005 - 1.030   pH 5.0 5.0 - 8.0   Glucose, UA 50 (A) NEGATIVE mg/dL   Hgb urine dipstick LARGE (A) NEGATIVE   Bilirubin Urine NEGATIVE NEGATIVE   Ketones, ur 5 (A) NEGATIVE mg/dL   Protein, ur 30 (A) NEGATIVE mg/dL   Nitrite NEGATIVE NEGATIVE   Leukocytes,Ua NEGATIVE NEGATIVE   RBC / HPF >50 (H) 0 - 5 RBC/hpf   WBC, UA 6-10 0 - 5 WBC/hpf   Bacteria, UA RARE (A) NONE SEEN   Squamous Epithelial / LPF 0-5 0 - 5   Mucus PRESENT    Hyaline Casts, UA PRESENT    Ca Oxalate Crys, UA PRESENT     Comment: Performed at [facility], [address] [place], [place] [address]   DG Chest 1 View  Result Date: 01/09/2021 CLINICAL DATA:  Shortness of breath and weakness EXAM: CHEST  1 VIEW COMPARISON:  01/08/2021 FINDINGS: Cardiomegaly, bilateral pleural effusions and bilateral LOWER lung atelectasis/consolidation noted. There is no evidence of pneumothorax. Mild pulmonary  vascular congestion is present. No acute bony abnormalities identified. IMPRESSION: Cardiomegaly, bilateral pleural effusions and bilateral LOWER lung atelectasis/consolidation. Electronically Signed   By: Harmon Pier M.D.   On: 01/09/2021 18:09   DG Chest 1 View  Result Date: 01/09/2021 CLINICAL DATA:  Shortness of breath and feeding tube placement. EXAM: CHEST  1 VIEW COMPARISON:  01/09/2021 and prior studies FINDINGS: Cardiomegaly, bilateral LOWER lung atelectasis/consolidation and bilateral pleural effusions noted. No pneumothorax identified. A small bore feeding tube with tip entering the stomach and off the field of view noted. IMPRESSION: Cardiomegaly, bilateral LOWER lung atelectasis/consolidation and bilateral pleural effusions. Electronically Signed   By: Harmon Pier M.D.   On: 01/09/2021 18:08   CT HEAD WO CONTRAST  Result Date: 01/08/2021 CLINICAL DATA:  Altered mental status. EXAM: CT HEAD WITHOUT CONTRAST TECHNIQUE: Contiguous axial images were obtained from the base of the skull through the vertex without intravenous contrast. COMPARISON:  None. FINDINGS: Brain: Moderately enlarged ventricles and subarachnoid spaces. Moderate patchy white matter low density in both cerebral hemispheres. No intracranial hemorrhage, mass lesion or CT evidence of acute infarction. Vascular: No hyperdense vessel or unexpected calcification. Skull: Normal. Negative for fracture or focal lesion. Sinuses/Orbits: Unremarkable. Other: None. IMPRESSION: 1. No acute abnormality. 2. Moderate diffuse cerebral and cerebellar atrophy. 3. Moderate chronic small vessel white matter ischemic changes in both cerebral hemispheres. Electronically Signed   By: Beckie Salts M.D.   On: 01/08/2021 16:58   MR BRAIN WO CONTRAST  Result Date: 01/09/2021 CLINICAL DATA:  Mental status  change.  Weakness. EXAM: MRI HEAD WITHOUT CONTRAST TECHNIQUE: Multiplanar, multiecho pulse sequences of the brain and surrounding structures were obtained  without intravenous contrast. COMPARISON:  CT head 01/08/2021 FINDINGS: Brain: Acute infarct involving most of the posterior limb internal capsule on the right. No other areas of acute infarct. Mild white matter changes in the periventricular deep white matter bilaterally. No hemorrhage or mass. Mild ventricular enlargement due to atrophy Patient not able to hold still with significant motion degrading the images. 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 Mild chronic microvascular ischemic changes in the white matter. Mild atrophy. Electronically Signed   By: Marlan Palau M.D.   On: 01/09/2021 15:15   DG Chest Portable 1 View  Result Date: 01/08/2021 CLINICAL DATA:  Shortness of breath EXAM: PORTABLE CHEST 1 VIEW COMPARISON:  None. FINDINGS: Small moderate bilateral pleural effusions with basilar airspace disease. Cardiomegaly with aortic atherosclerosis. No pneumothorax. IMPRESSION: Small to moderate bilateral pleural effusions with basilar airspace disease, atelectasis versus pneumonia. Cardiomegaly Electronically Signed   By: Jasmine Pang M.D.   On: 01/08/2021 02:56   [place] EKG SITE RITE  Result Date: 01/09/2021 If Site Rite image not attached, placement could not be confirmed due to current cardiac rhythm.   Assessment: 80 y.o. female presenting with HFrEF, AMS and multiple metabolic abnormalities. EKG showed new onset atrial fibrillation. 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  1. Exam reveals left facial droop, severe dysarthria, plegic LUE, paretic LLE and difficulty gazing to the left.  2. MRI brain: Acute infarct involving most of the posterior limb internal capsule on the right. Mild white matter changes in the periventricular deep white matter bilaterally. Mild ventricular enlargement due to atrophy 3. Stroke Risk Factors - DM2, CAD,  CHF (EF of 20%), CAD and HTN 4. 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 Atheroembolic etiology also possible.   Recommendations: 1. Cardiac work up and management per Cardiology team. Had recent echocardiogram in January, but may need to repeat.  2. MRA of head. Carotid ultrasound.  3. PT consult, OT consult, Speech consult 4. Cardiac telemetry 5. Continue heparin infusion and start oral anticoagulation when able to take PO. Benefits of anticoagulation outweigh risks given relatively small size of her stroke with relatively low risk of hemorrhagic conversion, versus high risk of stroke recurrence if off anticoagulation.  6. BP management.per Cardiology and primary team. Out of the permissive HTN time window.  7. Frequent neuro checks  @Electronically  signed: Dr.  01/09/2021, 8:18 PM

## 2021-01-09 NOTE — Consult Note (Signed)
ANTICOAGULATION CONSULT NOTE  Pharmacy Consult for IV Heparin Indication: atrial fibrillation  Patient Measurements: Heparin Dosing Weight: 54.4 kg  Labs: Recent Labs    01/08/21 0229 01/08/21 0521 01/08/21 0637 01/08/21 1101 01/09/21 0412  HGB 14.3  --   --   --  16.9*  HCT 41.5  --   --   --  48.1*  PLT 162  --   --   --  143*  CREATININE 1.03* 1.04*  --  1.04* 1.34*  TROPONINIHS 78*  --  77*  --   --     Estimated Creatinine Clearance: 27.8 mL/min (A) (by C-G formula based on SCr of 1.34 mg/dL (H)).   Medical History: Past Medical History:  Diagnosis Date  . CAD (coronary artery disease)   . Hypertension     Medications:  No anticoagulation PTA Apixaban 5 mg BID (new) started 4/9 - last dose 4/9 at 2222  Assessment: Patient is a 80 y/o F with medical history including CAD, HFrEF with EF 20%, HTN who presented to the ED 4/9 with weakness and was ultimately admitted with acute CHF exacerbation and new-onset Afib with RVR. Cardiology is following. Patient is unable to take PO at this time. Thus pharmacy has been consulted to transition patient from apixaban to IV heparin for Afib.   CBC with mildly elevated H&H, mild thrombocytopenia. Baseline aPTT, PT-INR, anti-Xa level pending.   Goal of Therapy:  aPTT 66 - 102 seconds Monitor platelets by anticoagulation protocol: Yes   Plan:  --Heparin 2500 unit IV bolus x 1 followed by continuous infusion at 750 units/hr --Will check aPTT 8 hours after initiation of infusion. Follow aPTT for now given presumed interference of apixaban on anti-Xa level. Can change back to anti-Xa level monitoring once levels correlate --Daily CBC per protocol while on IV heparin  Tressie Ellis 01/09/2021,12:06 PM

## 2021-01-09 NOTE — Progress Notes (Signed)
Attempted to call Chloe RN regarding PICC placement but no response.

## 2021-01-10 ENCOUNTER — Inpatient Hospital Stay: Payer: Medicare Other

## 2021-01-10 DIAGNOSIS — I4891 Unspecified atrial fibrillation: Secondary | ICD-10-CM | POA: Diagnosis not present

## 2021-01-10 DIAGNOSIS — I5021 Acute systolic (congestive) heart failure: Secondary | ICD-10-CM | POA: Diagnosis not present

## 2021-01-10 DIAGNOSIS — J9601 Acute respiratory failure with hypoxia: Secondary | ICD-10-CM | POA: Diagnosis not present

## 2021-01-10 DIAGNOSIS — E440 Moderate protein-calorie malnutrition: Secondary | ICD-10-CM

## 2021-01-10 DIAGNOSIS — I5023 Acute on chronic systolic (congestive) heart failure: Secondary | ICD-10-CM | POA: Diagnosis not present

## 2021-01-10 DIAGNOSIS — Z7189 Other specified counseling: Secondary | ICD-10-CM

## 2021-01-10 DIAGNOSIS — Z515 Encounter for palliative care: Secondary | ICD-10-CM

## 2021-01-10 DIAGNOSIS — Z66 Do not resuscitate: Secondary | ICD-10-CM

## 2021-01-10 LAB — CBC
HCT: 40.9 % (ref 36.0–46.0)
Hemoglobin: 14.7 g/dL (ref 12.0–15.0)
MCH: 32.2 pg (ref 26.0–34.0)
MCHC: 35.9 g/dL (ref 30.0–36.0)
MCV: 89.7 fL (ref 80.0–100.0)
Platelets: 125 10*3/uL — ABNORMAL LOW (ref 150–400)
RBC: 4.56 MIL/uL (ref 3.87–5.11)
RDW: 16 % — ABNORMAL HIGH (ref 11.5–15.5)
WBC: 19.6 10*3/uL — ABNORMAL HIGH (ref 4.0–10.5)
nRBC: 0 % (ref 0.0–0.2)

## 2021-01-10 LAB — HEMOGLOBIN A1C
Hgb A1c MFr Bld: 6.9 % — ABNORMAL HIGH (ref 4.8–5.6)
Mean Plasma Glucose: 151.33 mg/dL

## 2021-01-10 LAB — BASIC METABOLIC PANEL
Anion gap: 19 — ABNORMAL HIGH (ref 5–15)
Anion gap: 20 — ABNORMAL HIGH (ref 5–15)
Anion gap: 22 — ABNORMAL HIGH (ref 5–15)
BUN: 74 mg/dL — ABNORMAL HIGH (ref 8–23)
BUN: 77 mg/dL — ABNORMAL HIGH (ref 8–23)
BUN: 73 mg/dL — ABNORMAL HIGH (ref 8–23)
CO2: 21 mmol/L — ABNORMAL LOW (ref 22–32)
CO2: 20 mmol/L — ABNORMAL LOW (ref 22–32)
CO2: 19 mmol/L — ABNORMAL LOW (ref 22–32)
Calcium: 9.5 mg/dL (ref 8.9–10.3)
Calcium: 9.2 mg/dL (ref 8.9–10.3)
Calcium: 9.6 mg/dL (ref 8.9–10.3)
Chloride: 90 mmol/L — ABNORMAL LOW (ref 98–111)
Chloride: 89 mmol/L — ABNORMAL LOW (ref 98–111)
Chloride: 88 mmol/L — ABNORMAL LOW (ref 98–111)
Creatinine, Ser: 1.96 mg/dL — ABNORMAL HIGH (ref 0.44–1.00)
Creatinine, Ser: 2.07 mg/dL — ABNORMAL HIGH (ref 0.44–1.00)
Creatinine, Ser: 1.85 mg/dL — ABNORMAL HIGH (ref 0.44–1.00)
GFR, Estimated: 26 mL/min — ABNORMAL LOW (ref >60)
GFR, Estimated: 24 mL/min — ABNORMAL LOW (ref >60)
GFR, Estimated: 28 mL/min — ABNORMAL LOW (ref >60)
Glucose, Bld: 132 mg/dL — ABNORMAL HIGH (ref 70–99)
Glucose, Bld: 157 mg/dL — ABNORMAL HIGH (ref 70–99)
Glucose, Bld: 121 mg/dL — ABNORMAL HIGH (ref 70–99)
Potassium: 5.4 mmol/L — ABNORMAL HIGH (ref 3.5–5.1)
Potassium: 5.1 mmol/L (ref 3.5–5.1)
Potassium: 5.9 mmol/L — ABNORMAL HIGH (ref 3.5–5.1)
Sodium: 130 mmol/L — ABNORMAL LOW (ref 135–145)
Sodium: 129 mmol/L — ABNORMAL LOW (ref 135–145)
Sodium: 129 mmol/L — ABNORMAL LOW (ref 135–145)

## 2021-01-10 LAB — APTT
aPTT: 136 seconds — ABNORMAL HIGH (ref 24–36)
aPTT: 92 seconds — ABNORMAL HIGH (ref 24–36)

## 2021-01-10 LAB — PROCALCITONIN: Procalcitonin: 0.44 ng/mL

## 2021-01-10 LAB — GLUCOSE, CAPILLARY
Glucose-Capillary: 146 mg/dL — ABNORMAL HIGH (ref 70–99)
Glucose-Capillary: 150 mg/dL — ABNORMAL HIGH (ref 70–99)
Glucose-Capillary: 127 mg/dL — ABNORMAL HIGH (ref 70–99)
Glucose-Capillary: 160 mg/dL — ABNORMAL HIGH (ref 70–99)

## 2021-01-10 LAB — LIPID PANEL
Cholesterol: 161 mg/dL (ref 0–200)
HDL: 36 mg/dL — ABNORMAL LOW (ref >40)
LDL Cholesterol: 109 mg/dL — ABNORMAL HIGH (ref 0–99)
Total CHOL/HDL Ratio: 4.5 RATIO
Triglycerides: 79 mg/dL (ref <150)
VLDL: 16 mg/dL (ref 0–40)

## 2021-01-10 LAB — HEPARIN LEVEL (UNFRACTIONATED): Heparin Unfractionated: 1.53 IU/mL — ABNORMAL HIGH (ref 0.30–0.70)

## 2021-01-10 MED ORDER — FREE WATER
200.0000 mL | Status: DC
  Administered 2021-01-10 (×2): 200 mL

## 2021-01-10 MED ORDER — CHOLECALCIFEROL 10 MCG/ML (400 UNIT/ML) PO LIQD
2000.0000 [IU] | Freq: Every day | ORAL | Status: DC
  Administered 2021-01-10: 2000 [IU]
  Filled 2021-01-10 (×2): qty 5

## 2021-01-10 MED ORDER — ACETAMINOPHEN 325 MG PO TABS
650.0000 mg | ORAL_TABLET | Freq: Four times a day (QID) | ORAL | Status: DC | PRN
  Filled 2021-01-10: qty 2

## 2021-01-10 MED ORDER — INSULIN ASPART 100 UNIT/ML ~~LOC~~ SOLN
0.0000 [IU] | SUBCUTANEOUS | Status: DC
  Administered 2021-01-10: 3 [IU] via SUBCUTANEOUS
  Filled 2021-01-10: qty 1

## 2021-01-10 MED ORDER — AMIODARONE HCL 200 MG PO TABS
200.0000 mg | ORAL_TABLET | Freq: Two times a day (BID) | ORAL | Status: DC
  Administered 2021-01-10: 200 mg via ORAL
  Filled 2021-01-10: qty 1

## 2021-01-10 MED ORDER — HALOPERIDOL LACTATE 5 MG/ML IJ SOLN
0.5000 mg | INTRAMUSCULAR | Status: DC | PRN
  Administered 2021-01-11: 16:00:00 0.5 mg via INTRAVENOUS
  Filled 2021-01-10: qty 1

## 2021-01-10 MED ORDER — SODIUM CHLORIDE 0.9% FLUSH
10.0000 mL | Freq: Two times a day (BID) | INTRAVENOUS | Status: DC
  Administered 2021-01-10 – 2021-01-12 (×5): 10 mL

## 2021-01-10 MED ORDER — ATORVASTATIN CALCIUM 20 MG PO TABS: 40.0000 mg | ORAL_TABLET | Freq: Every day | ORAL | Status: DC

## 2021-01-10 MED ORDER — FREE WATER: 100.0000 mL | Status: DC

## 2021-01-10 MED ORDER — ADULT MULTIVITAMIN W/MINERALS CH
1.0000 | ORAL_TABLET | Freq: Every day | ORAL | Status: DC
  Administered 2021-01-10: 1
  Filled 2021-01-10: qty 1

## 2021-01-10 MED ORDER — SODIUM CHLORIDE 0.9% FLUSH
10.0000 mL | INTRAVENOUS | Status: DC | PRN
Start: 2021-01-10 — End: 2021-01-12

## 2021-01-10 MED ORDER — ONDANSETRON HCL 4 MG/2ML IJ SOLN
4.0000 mg | Freq: Four times a day (QID) | INTRAMUSCULAR | Status: DC | PRN
Start: 2021-01-10 — End: 2021-01-12

## 2021-01-10 MED ORDER — B COMPLEX-C PO TABS
1.0000 | ORAL_TABLET | Freq: Every day | ORAL | Status: DC
  Administered 2021-01-10: 1
  Filled 2021-01-10: qty 1

## 2021-01-10 MED ORDER — AMIODARONE HCL IN DEXTROSE 360-4.14 MG/200ML-% IV SOLN: 30.0000 mg/h | INTRAVENOUS | Status: DC

## 2021-01-10 MED ORDER — SODIUM ZIRCONIUM CYCLOSILICATE 5 G PO PACK
5.0000 g | PACK | Freq: Two times a day (BID) | ORAL | Status: DC
  Administered 2021-01-10: 5 g
  Filled 2021-01-10: qty 1

## 2021-01-10 MED ORDER — OSMOLITE 1.2 CAL PO LIQD
1000.0000 mL | ORAL | Status: DC
  Administered 2021-01-10: 1000 mL

## 2021-01-10 MED ORDER — ASCORBIC ACID 500 MG PO TABS
500.0000 mg | ORAL_TABLET | Freq: Every day | ORAL | Status: DC
  Administered 2021-01-10: 500 mg
  Filled 2021-01-10: qty 1

## 2021-01-10 MED ORDER — BIOTENE DRY MOUTH MT LIQD: 15.0000 mL | OROMUCOSAL | Status: DC | PRN

## 2021-01-10 MED ORDER — AMIODARONE HCL 200 MG PO TABS: 200.0000 mg | ORAL_TABLET | Freq: Two times a day (BID) | ORAL | Status: DC

## 2021-01-10 MED ORDER — ONDANSETRON HCL 4 MG PO TABS: 4.0000 mg | ORAL_TABLET | Freq: Four times a day (QID) | ORAL | Status: DC | PRN

## 2021-01-10 MED ORDER — POLYVINYL ALCOHOL 1.4 % OP SOLN
1.0000 [drp] | Freq: Four times a day (QID) | OPHTHALMIC | Status: DC | PRN
  Filled 2021-01-10: qty 15

## 2021-01-10 MED ORDER — HYDROMORPHONE HCL 1 MG/ML IJ SOLN
0.5000 mg | INTRAMUSCULAR | Status: DC | PRN
  Administered 2021-01-10 – 2021-01-11 (×3): 0.5 mg via INTRAVENOUS
  Filled 2021-01-10 (×3): qty 1

## 2021-01-10 MED ORDER — GLYCOPYRROLATE 0.2 MG/ML IJ SOLN: 0.2000 mg | INTRAMUSCULAR | Status: DC | PRN

## 2021-01-10 MED ORDER — SODIUM CHLORIDE 0.9 % IV BOLUS
500.0000 mL | Freq: Once | INTRAVENOUS | Status: AC
  Administered 2021-01-10: 500 mL via INTRAVENOUS

## 2021-01-10 MED ORDER — HEPARIN (PORCINE) 25000 UT/250ML-% IV SOLN
600.0000 [IU]/h | INTRAVENOUS | Status: DC
  Administered 2021-01-10: 600 [IU]/h via INTRAVENOUS

## 2021-01-10 MED ORDER — ACETAMINOPHEN 650 MG RE SUPP: 650.0000 mg | Freq: Four times a day (QID) | RECTAL | Status: DC | PRN

## 2021-01-10 NOTE — Consult Note (Signed)
ANTICOAGULATION CONSULT NOTE  Pharmacy Consult for IV Heparin Indication: atrial fibrillation  Patient Measurements: Heparin Dosing Weight: 54.4 kg  Labs: Recent Labs    01/08/21 0229 01/08/21 0521 01/08/21 0637 01/08/21 1101 01/09/21 0412 01/09/21 1227 01/09/21 2338  HGB 14.3  --   --   --  16.9*  --   --   HCT 41.5  --   --   --  48.1*  --   --   PLT 162  --   --   --  143*  --   --   APTT  --   --   --   --   --  35 136*  LABPROT  --   --   --   --   --  25.1*  --   INR  --   --   --   --   --  2.4*  --   HEPARINUNFRC  --   --   --   --   --  1.05*  --   CREATININE 1.03*   < >  --  1.04* 1.34*  --  1.85*  TROPONINIHS 78*  --  77*  --   --   --   --    < > = values in this interval not displayed.    Estimated Creatinine Clearance: 20.1 mL/min (A) (by C-G formula based on SCr of 1.85 mg/dL (H)).   Medical History: Past Medical History:  Diagnosis Date  . CAD (coronary artery disease)   . Hypertension     Medications:  No anticoagulation PTA Apixaban 5 mg BID (new) started 4/9 - last dose 4/9 at 2222  Assessment: Patient is a 80 y/o F with medical history including CAD, HFrEF with EF 20%, HTN who presented to the ED 4/9 with weakness and was ultimately admitted with acute CHF exacerbation and new-onset Afib with RVR. Cardiology is following. Patient is unable to take PO at this time. Thus pharmacy has been consulted to transition patient from apixaban to IV heparin for Afib.   CBC with mildly elevated H&H, mild thrombocytopenia. Baseline aPTT, PT-INR, anti-Xa level pending.   Goal of Therapy:  aPTT 66 - 102 seconds Monitor platelets by anticoagulation protocol: Yes   0410 2338 aPTT 136, supratherapeutic   Plan:  --Contacted RN to hold infusion for 1 hour --Restart heparin infusion after 1 hr hold at 600 units/hr --Will recheck aPTT 8 hours after restart of infusion. Follow aPTT for now given presumed interference of apixaban on anti-Xa level. Can change back  to anti-Xa level monitoring once levels correlate --Daily CBC per protocol while on IV heparin  Otelia Sergeant, PharmD, [facility] 01/10/2021 12:45 AM

## 2021-01-10 NOTE — Consult Note (Signed)
Consultation Note Date: 01/10/2021   Patient Name: Jaclyn Meadows  DOB: [birth year]  MRN: [id number]  Age / Sex: 80 y.o., female   PCP: Jola Baptist, MD Referring Physician: Barb Merino, MD   REASON FOR CONSULTATION:Establishing goals of care  Palliative Care consult requested for goals of care discussion in this 80 y.o. female with a medical history significant for hypertension, coronary artery disease, and CHF (EF 20%).  Patient presented to the ED via EMS with complaints of weakness and altered mental status.  Showed BUN 52, creatinine 1.03, BNP 3916, WBC 11.7.  Covid PCR negative.  EKG showed new onset atrial fibrillation with rapid RVR.  Chest x-ray showed small to moderate bilateral pleural effusions with bilateral airspace disease.  This admission MRI shows right acute infarct.  Neurology and cardiology following.  Clinical Assessment and Goals of Care: I have reviewed medical records including lab results, imaging, Epic notes, and MAR, received report from the bedside RN, and assessed the patient.   I met at the bedside with patient's children Ysidro Evert, Robynn Pane, and daughter-in-law Crystal) to discuss diagnosis prognosis, [place], EOL wishes, disposition and options.  Jaclyn Meadows assessed with noticeable dysarthria, left facial droop, and minimally responsive.  She will open eyes intermittently on command and speak a few words.  I introduced Palliative Medicine as specialized medical care for people living with serious illness. It focuses on providing relief from the symptoms and stress of a serious illness. The goal is to improve quality of life for both the patient and the family.  Family verbalized understanding and appreciation  We discussed a brief life review of the patient, along with her functional and nutritional status.  Family shares patient lives in the home with her son Ysidro Evert.  Her husband of more than 50 years passed away 3 years ago.  She has 3 children who are  actively involved in her care.  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  She enjoyed being outside and working in her garden.  3 weeks prior to admission patient was ambulatory without assistive devices.  Able to perform most ADLs independently.  Good appetite.  Family reports over the past week patient began to decline with noticeable altered mental status, increased bilateral lower extremity edema, decreased appetite, and increased fatigue.  Family states patient was prescribed Lasix however would often not take because she did not like going to the bathroom so frequently.  We discussed Her current illness and what it means in the larger context of Her on-going co-morbidities. Natural disease trajectory and expectations at EOL were discussed.  A detailed discussion was had today regarding advanced directives.  Concepts specific to code status, artifical feeding and hydration, continued IV antibiotics and rehospitalization.  I discussed her full CODE STATUS at length with consideration of her current illness and comorbidities.  Family is tearful in discussion expressing their understanding of patient's current condition and poor prognosis.  All children mutually agreed to transition CODE STATUS to DNR/DNI.  Education provided on what DNR would look like allowing patient a natural death without heroic interventions.  Family verbalized understanding again confirming wishes for DNR.  The difference between a aggressive medical intervention and a palliative comfort care path were discussed at length. Values and goals of care important to patient and family were attempted to be elicited.   Family share patient's decline in quality of life and the heart rate of watching her lying in  bed in her current state.  Emotional support provided.  They have received extensive updates from attending in addition to current conversation.  They would like to receive updates from  neurology prior to making any final decisions.   I discussed the importance of continued conversation with family and their medical providers regarding overall plan of care and treatment options, ensuring decisions are within the context of the patients values and GOCs.  Hospice and Palliative Care services outpatient were explained and offered. Patient and family verbalized their understanding and awareness of both palliative and hospice's goals and philosophy of care.   Questions and concerns were addressed.  The family was encouraged to call with questions or concerns.  PMT will continue to support holistically as needed.  1420: Family called requesting to have a follow-up discussion after their updates from neurology.  We discussed at length updates allowing family time to express their emotions.  Emotional support provided.  Family have all mutually agreed they would like to transition patient's care to focus on her comfort allowing her to spend what time she has left at peace.  I discussed at length with family what comfort care will look like for Jaclyn Meadows while hospitalized.  We discussed minimizing medications to focus on symptom management and her comfort, discontinuing frequent vital signs, tube feeding, lab work, and radiology testing.  Family is aware all care will focus on how she is looking and feeling. This would include management of any symptoms that may cause discomfort, pain, shortness of breath, cough, nausea, agitation, anxiety, and/or secretions etc. Symptoms would be managed with medications and other non-pharmacological interventions such as spiritual support if requested, repositioning, music therapy, or therapeutic listening.  Family has requested to leave core track in place for visual comfort to them.  With understanding and approval to discontinue continuous feedings.   We discussed at length outpatient hospice support at home with family providing care versus residential  hospice facility.  Family is requesting hospice home referral with preferred location of [place].  Education provided on referral and transfer process with awareness patient may remain hospitalized with an anticipated hospital death depending on stability of symptoms.  I also provided education that core track would be discontinued prior to patient transferring to the hospice home.  Family verbalized understanding and appreciation of support.  All questions answered and support provided.  CODE STATUS: DNR  ADVANCE DIRECTIVES: Primary Decision Maker: Children  SYMPTOM MANAGEMENT: See below  Palliative Prophylaxis:   Aspiration, Eye Care, Frequent Pain Assessment, Oral Care and Turn Reposition  PSYCHO-SOCIAL/SPIRITUAL:  Support System: Family Desire for further Chaplaincy support: No  Additional Recommendations (Limitations, Scope, Preferences):  Full Comfort Care  Education on hospice/palliative    PAST MEDICAL HISTORY: Past Medical History:  Diagnosis Date  . CAD (coronary artery disease)   . Hypertension     ALLERGIES:  is allergic to buckwheat, flaxseed (linseed), okra, other, strawberry extract, epinephrine, neomycin, sulfur dioxide, azithromycin, erythromycin, and amoxicillin.   MEDICATIONS:  Current Facility-Administered Medications  Medication Dose Route Frequency Provider Last Rate Last Admin  . acetaminophen (TYLENOL) tablet 650 mg  650 mg Per Tube Q6H PRN Barb Merino, MD       Or  . acetaminophen (TYLENOL) suppository 650 mg  650 mg Rectal Q6H PRN Barb Merino, MD      . amiodarone (PACERONE) tablet 200 mg  200 mg Per Tube BID Barb Merino, MD      . ascorbic acid (VITAMIN C) tablet 500 mg  500 mg Per Tube Daily Dallie Piles, RPH   500 mg at 01/10/21 0623  . B-complex with vitamin C tablet 1 tablet  1 tablet Per Tube Daily Dallie Piles, RPH   1 tablet at 01/10/21 7628  . ceFEPIme (MAXIPIME) 2 g in sodium chloride 0.9 % 100 mL IVPB  2 g Intravenous  Q24H Vira Blanco, RPH      . Chlorhexidine Gluconate Cloth 2 % PADS 6 each  6 each Topical Daily Mansy, Arvella Merles, MD   6 each at 01/10/21 [phone]  . cholecalciferol (D-VI-SOL) 10 MCG/ML oral liquid 2,000 Units  2,000 Units Per Tube Daily Dallie Piles, RPH   2,000 Units at 01/10/21 1124  . famotidine (PEPCID) IVPB 20 mg premix  20 mg Intravenous Q24H Barb Merino, MD 100 mL/hr at 01/10/21 0943 20 mg at 01/10/21 0943  . feeding supplement (OSMOLITE 1.2 CAL) liquid 1,000 mL  1,000 mL Per Tube Continuous Barb Merino, MD 55 mL/hr at 01/10/21 1306 1,000 mL at 01/10/21 1306  . free water 100 mL  100 mL Per Tube Q4H Ghimire, Dante Gang, MD      . heparin ADULT infusion 100 units/mL (25000 units/227m)  600 Units/hr Intravenous Continuous BRenda Rolls RPH 6 mL/hr at 01/10/21 0700 600 Units/hr at 01/10/21 0700  . insulin aspart (novoLOG) injection 0-15 Units  0-15 Units Subcutaneous Q4H GBarb Merino MD   3 Units at 01/10/21 1324  . metroNIDAZOLE (FLAGYL) IVPB 500 mg  500 mg Intravenous Q8H GBarb Merino MD 100 mL/hr at 01/10/21 0823 500 mg at 01/10/21 0823  . multivitamin with minerals tablet 1 tablet  1 tablet Per Tube Daily GDallie Piles RPH   1 tablet at 01/10/21 07616 . ondansetron (ZOFRAN) tablet 4 mg  4 mg Per Tube Q6H PRN GBarb Merino MD       Or  . ondansetron (ZOFRAN) injection 4 mg  4 mg Intravenous Q6H PRN GBarb Merino MD      . sodium chloride flush (NS) 0.9 % injection 10-40 mL  10-40 mL Intracatheter Q12H GBarb Merino MD   10 mL at 01/10/21 1326  . sodium chloride flush (NS) 0.9 % injection 10-40 mL  10-40 mL Intracatheter PRN GBarb Merino MD      . sodium zirconium cyclosilicate (LOKELMA) packet 5 g  5 g Per Tube BID MSharion Settler NP   5 g at 01/10/21 0547    VITAL SIGNS: BP 105/81   Pulse 72   Temp 97.8 F (36.6 C)   Resp 12   Ht '5\' 2"'  (1.575 m)   Wt 53.1 kg   SpO2 98%   BMI 21.41 kg/m  Filed Weights   01/08/21 0700 01/09/21 0500 01/10/21 0500  Weight:  53.9 kg 53.1 kg 53.1 kg    Estimated body mass index is 21.41 kg/m as calculated from the following:   Height as of this encounter: '5\' 2"'  (1.575 m).   Weight as of this encounter: 53.1 kg.  LABS: CBC:    Component Value Date/Time   WBC 19.6 (H) 01/10/2021 0442   HGB 14.7 01/10/2021 0442   HCT 40.9 01/10/2021 0442   PLT 125 (L) 01/10/2021 0442   Comprehensive Metabolic Panel:    Component Value Date/Time   NA 129 (L) 01/10/2021 1030   K 5.1 01/10/2021 1030   BUN 77 (H) 01/10/2021 1030   CREATININE 2.07 (H) 01/10/2021 1030     Review of Systems  Unable to perform ROS: Patient  unresponsive    Physical Exam General: Patient observed moaning, lethargic, frail chronically-ill appearing Cardiovascular: Irregular Pulmonary: diminished bilaterally  Abdomen: soft, nontender, + bowel sounds Neurological: Lethargic, will awaken intermittently to verbal stimuli but quickly closes eyes and falls back to sleep, does not move left side   Prognosis: < 2 weeks (days)-in the setting of acute ischemic CVA, atrial fibrillation with RVR, aspiration pneumonia, hypertension, diabetes, AKI (BUN 77, UOP 140 x 24 hours), systolic CHF, failure to thrive, malnutrition, deconditioning, unresponsive.  Discharge Planning:  Hospice facility versus anticipated hospital death  Recommendations: . DNR/DNI-as requested and confirmed by children . Transition all care to focus on comfort/end-of-life . We will discontinue medical interventions and minimize medications not focused on comfort . Detailed discussion and education provided to family regarding patient's current illness and poor prognosis.  Updates also provided by attending, cardiology, and neurology.  Family verbalizes understanding of patient's poor prognosis and drastic decline in quality of life.  They have requested to focus on her comfort/end-of-life. . Family requesting residential hospice home referral.  (TOC referral placed) Dilaudid PRN  for pain/air hunger/comfort Robinul PRN for excessive secretions Ativan PRN for agitation/anxiety Zofran PRN for nausea Liquifilm tears PRN for dry eyes Haldol PRN for agitation/anxiety May have comfort feeding Comfort cart for family Unrestricted visitations in the setting of EOL (per policy) Oxygen PRN 2L or less for comfort. No escalation.  PMT will continue to support and follow as needed. Please call team line with urgent needs.   Palliative Performance Scale: PPS 10%              Family expressed understanding and was in agreement with this plan.   Thank you for allowing the Palliative Medicine Team to assist in the care of this patient. Please utilize secure chat with additional questions, if there is no response within 30 minutes please call the above phone number.   Time: 1230-1315 Time: 1420-1500 Time Total: 90 min  Visit consisted of counseling and education dealing with the complex and emotionally intense issues of symptom management and palliative care in the setting of serious and potentially life-threatening illness.Greater than 50%  of this time was spent counseling and coordinating care related to the above assessment and plan.  Signed by:  Alda Lea, AGPCNP-BC Palliative Medicine Team  Phone: [phone] Pager: [phone] Amion: [facility] Team providers are available by phone from 7am to 7pm daily and can be reached through the team cell phone.  Should this patient require assistance outside of these hours, please call the patient's attending physician.

## 2021-01-10 NOTE — Progress Notes (Signed)
Peripherally Inserted Central Catheter Placement  The IV Nurse has discussed with the patient and/or persons authorized to consent for the patient, the purpose of this procedure and the potential benefits and risks involved with this procedure.  The benefits include less needle sticks, lab draws from the catheter, and the patient may be discharged home with the catheter. Risks include, but not limited to, infection, bleeding, blood clot (thrombus formation), and puncture of an artery; nerve damage and irregular heartbeat and possibility to perform a PICC exchange if needed/ordered by physician.  Alternatives to this procedure were also discussed.  Bard Power PICC patient education guide, fact sheet on infection prevention and patient information card has been provided to patient /or left at bedside.    PICC Placement Documentation  PICC Triple Lumen 01/10/21 PICC Right Brachial 36 cm 0 cm (Active)  Indication for Insertion or Continuance of Line Limited venous access - need for IV therapy >5 days (PICC only);Vasoactive infusions 01/10/21 1213  Exposed Catheter (cm) 0 cm 01/10/21 1213  Site Assessment Clean;Dry;Intact 01/10/21 1213  Lumen #1 Status Flushed;Saline locked;Blood return noted 01/10/21 1213  Lumen #2 Status Flushed;Saline locked;Blood return noted 01/10/21 1213  Lumen #3 Status Flushed;Saline locked;Blood return noted 01/10/21 1213  Dressing Type Transparent;Securing device 01/10/21 1213  Dressing Status Clean;Dry;Intact 01/10/21 1213  Antimicrobial disc in place? Yes 01/10/21 1213  Dressing Intervention New dressing 01/10/21 1213  Dressing Change Due 01/17/21 01/10/21 1213       Annett Fabian 01/10/2021, 12:15 PM

## 2021-01-10 NOTE — Progress Notes (Signed)
Patient Name: Jaclyn Meadows Date of Encounter: 01/10/2021  Hospital Problem List     Active Problems:   Acute CHF (congestive heart failure) (HCC)   Malnutrition of moderate degree    Patient Profile       80 y.o.femalewith history ofHFrEf with know EF of 15-20% with insignificant cad by cath in 08/27/20 at [facility] with a 30-40% pros and mid lad stenosis and luminal irreg in the remainder of the lad with a 50% prox D2 with insignificant disease in the circ who has refused asa or statin therapy per note from 01/04/21, has a mildly dilated thoracic aorta who was admitted with progressive weakness and sob. She is scheduled to be on Entresto 49/51 mg bid, coreg 25 mg bid and lasix 20 mg daily. She appears to have been relatively noncompliant with her diuretics and also non compliant with her other meds. She has a mild hsTrop elevation which is flat c/w demand At 78,77. BNP was 3916. Last know BNP was 11/21 and it was 277. LDL 11/21 was 79 Was last seen by her primary cardiology office 4/5 and was stressed compliance with meds. CXR shows smal to moderate bilateral pleural effusions with cardiomegaly. EKG showed nsr with incomplete lbbb.   Currently minimally responsive with evidence of a CVA.  Unable to take p.o.  Hemodynamically stable.  Subjective   Unresponsive  Inpatient Medications    . sodium chloride flush  10-40 mL Intracatheter Q12H  . sodium zirconium cyclosilicate  5 g Per Tube BID    Vital Signs    Vitals:   01/10/21 0900 01/10/21 1000 01/10/21 1100 01/10/21 1306  BP: 111/74 (!) 84/63 110/74 94/83  Pulse: 72 66 71 72  Resp: 10 13 14 14   Temp:      TempSrc:      SpO2: 92% 96% 100% 100%  Weight:      Height:        Intake/Output Summary (Last 24 hours) at 01/10/2021 1658 Last data filed at 01/10/2021 1326 Gross per 24 hour  Intake 1060.34 ml  Output 40 ml  Net 1020.34 ml   Filed Weights   01/08/21 0700 01/09/21 0500 01/10/21 0500  Weight: 53.9 kg 53.1 kg 53.1 kg     Physical Exam    GEN: Chronically ill-appearing HEENT: normal.  Neck: Supple, no JVD, carotid bruits, or masses. Cardiac: RRR, no murmurs, rubs, or gallops. No clubbing, cyanosis, edema.  Radials/DP/PT 2+ and equal bilaterally.  Respiratory:  Respirations regular and unlabored, clear to auscultation bilaterally. GI: Soft, nontender, nondistended, BS + x 4. MS: no deformity or atrophy. Skin: warm and dry, no rash. Neuro: Minimally responsive  Labs    CBC Recent Labs    01/08/21 0229 01/09/21 0412 01/10/21 0442  WBC 11.7* 17.9* 19.6*  NEUTROABS 10.4* 15.5*  --   HGB 14.3 16.9* 14.7  HCT 41.5 48.1* 40.9  MCV 90.8 90.4 89.7  PLT 162 143* 125*   Basic Metabolic Panel Recent Labs    03/12/21 1101 01/09/21 0412 01/09/21 2338 01/10/21 0442 01/10/21 1030  NA 128* 129*   < > 130* 129*  K 4.8 5.4*   < > 5.4* 5.1  CL 88* 90*   < > 90* 89*  CO2 23 20*   < > 21* 20*  GLUCOSE 290* 72   < > 132* 157*  BUN 55* 61*   < > 74* 77*  CREATININE 1.04* 1.34*   < > 1.96* 2.07*  CALCIUM 9.8 10.1   < >  9.5 9.2  MG 2.5* 2.6*  --   --   --   PHOS  --  5.1*  --   --   --    < > = values in this interval not displayed.   Liver Function Tests No results for input(s): AST, ALT, ALKPHOS, BILITOT, PROT, ALBUMIN in the last 72 hours. No results for input(s): LIPASE, AMYLASE in the last 72 hours. Cardiac Enzymes No results for input(s): CKTOTAL, CKMB, CKMBINDEX, TROPONINI in the last 72 hours. BNP Recent Labs    01/08/21 0235 01/09/21 0412  BNP 3,916.8* >4,500.0*   D-Dimer No results for input(s): DDIMER in the last 72 hours. Hemoglobin A1C Recent Labs    01/10/21 0442  HGBA1C 6.9*   Fasting Lipid Panel Recent Labs    01/10/21 0442  CHOL 161  HDL 36*  LDLCALC 109*  TRIG 79  CHOLHDL 4.5   Thyroid Function Tests Recent Labs    01/09/21 0412  TSH 4.580*    Telemetry    Sinus rhythm  ECG    A. fib with RVR  Radiology    DG Chest 1 View  Result Date:  01/09/2021 CLINICAL DATA:  Shortness of breath and weakness EXAM: CHEST  1 VIEW COMPARISON:  01/08/2021 FINDINGS: Cardiomegaly, bilateral pleural effusions and bilateral LOWER lung atelectasis/consolidation noted. There is no evidence of pneumothorax. Mild pulmonary vascular congestion is present. No acute bony abnormalities identified. IMPRESSION: Cardiomegaly, bilateral pleural effusions and bilateral LOWER lung atelectasis/consolidation. Electronically Signed   By: Harmon Pier M.D.   On: 01/09/2021 18:09   DG Chest 1 View  Result Date: 01/09/2021 CLINICAL DATA:  Shortness of breath and feeding tube placement. EXAM: CHEST  1 VIEW COMPARISON:  01/09/2021 and prior studies FINDINGS: Cardiomegaly, bilateral LOWER lung atelectasis/consolidation and bilateral pleural effusions noted. No pneumothorax identified. A small bore feeding tube with tip entering the stomach and off the field of view noted. IMPRESSION: Cardiomegaly, bilateral LOWER lung atelectasis/consolidation and bilateral pleural effusions. Electronically Signed   By: Harmon Pier M.D.   On: 01/09/2021 18:08   DG Abd 1 View  Result Date: 01/10/2021 CLINICAL DATA:  Feeding tube placement EXAM: ABDOMEN - 1 VIEW COMPARISON:  01/09/2021 at 11:19 p.m. FINDINGS: Nasoenteric feeding tube has been slightly withdrawn with its tip now overlying the expected mid body of the stomach. Small right pleural effusion is present. Retrocardiac opacification representing atelectasis, infiltrate, or posteriorly layering pleural fluid again noted. Mild cardiomegaly noted. The visualized abdominal gas pattern is unremarkable. Pelvis excluded from view. IMPRESSION: Nasoenteric feeding tube tip within the mid body of the stomach. Electronically Signed   By: Helyn Numbers MD   On: 01/10/2021 01:01   DG Abd 1 View  Result Date: 01/09/2021 CLINICAL DATA:  Feeding tube placement. EXAM: ABDOMEN - 1 VIEW COMPARISON:  Chest and abdomen radiographs obtained earlier today.  FINDINGS: Feeding tube tip in the upper pelvis on the left following a course for an elongated stomach. Normal bowel gas pattern. Previously noted enlarged cardiac silhouette and dense bibasilar atelectasis or pneumonia and pleural fluid. Lumbar and lower thoracic spine degenerative changes and mild dextroconvex scoliosis. IMPRESSION: 1. Feeding tube tip in the upper pelvis on the left following a course for an elongated stomach. 2. Stable dense bibasilar atelectasis or pneumonia and pleural fluid. Electronically Signed   By: Beckie Salts M.D.   On: 01/09/2021 23:31   CT HEAD WO CONTRAST  Result Date: 01/08/2021 CLINICAL DATA:  Altered mental status. EXAM: CT HEAD WITHOUT  CONTRAST TECHNIQUE: Contiguous axial images were obtained from the base of the skull through the vertex without intravenous contrast. COMPARISON:  None. FINDINGS: Brain: Moderately enlarged ventricles and subarachnoid spaces. Moderate patchy white matter low density in both cerebral hemispheres. No intracranial hemorrhage, mass lesion or CT evidence of acute infarction. Vascular: No hyperdense vessel or unexpected calcification. Skull: Normal. Negative for fracture or focal lesion. Sinuses/Orbits: Unremarkable. Other: None. IMPRESSION: 1. No acute abnormality. 2. Moderate diffuse cerebral and cerebellar atrophy. 3. Moderate chronic small vessel white matter ischemic changes in both cerebral hemispheres. Electronically Signed   By: Beckie Salts M.D.   On: 01/08/2021 16:58   MR BRAIN WO CONTRAST  Result Date: 01/09/2021 CLINICAL DATA:  Mental status change.  Weakness. EXAM: MRI HEAD WITHOUT CONTRAST TECHNIQUE: Multiplanar, multiecho pulse sequences of the brain and surrounding structures were obtained without intravenous contrast. COMPARISON:  CT head 01/08/2021 FINDINGS: Brain: Acute infarct involving most of the posterior limb internal capsule on the right. No other areas of acute infarct. Mild white matter changes in the periventricular  deep white matter bilaterally. No hemorrhage or mass. Mild ventricular enlargement due to atrophy Patient not able to hold still with significant motion degrading the images. 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 Mild chronic microvascular ischemic changes in the white matter. Mild atrophy. Electronically Signed   By: Marlan Palau M.D.   On: 01/09/2021 15:15   DG Chest Portable 1 View  Result Date: 01/08/2021 CLINICAL DATA:  Shortness of breath EXAM: PORTABLE CHEST 1 VIEW COMPARISON:  None. FINDINGS: Small moderate bilateral pleural effusions with basilar airspace disease. Cardiomegaly with aortic atherosclerosis. No pneumothorax. IMPRESSION: Small to moderate bilateral pleural effusions with basilar airspace disease, atelectasis versus pneumonia. Cardiomegaly Electronically Signed   By: Jasmine Pang M.D.   On: 01/08/2021 02:56   [place] EKG SITE RITE  Result Date: 01/09/2021 If Site Rite image not attached, placement could not be confirmed due to current cardiac rhythm.   Assessment & Plan    1. 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   -TTE 1/4/22with EF 15-20% -pt hasas history of non compliance. States she takes meds but difficult historian  -Minimally responsive.  Brain MRI consistent with CVA.  Poor prognosis. Agree with palliative care discussed at length with multiple family members.  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 Mildly dilated thoracic aorta-  4.  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  Agree with comfort measures.   Signed, Darlin Priestly Gonzalo Waymire MD 01/10/2021, 4:58 PM  Pager: [phone]

## 2021-01-10 NOTE — Progress Notes (Signed)
Dht pulled back from 68 to 61,per MD, will re-image for placement

## 2021-01-10 NOTE — Progress Notes (Signed)
SLP Cancellation Note  Patient Details Name: BRIZZA NATHANSON MRN: [id number] DOB: [birth year]   Cancelled treatment:       Reason Eval/Treat Not Completed: Patient not medically ready    Pt very lethargic. When pt is appropriate, nurse will provide [facility] Ill Screen and reconsult ST services if indicated.   Randilyn Foisy B. Dreama Saa M.S., CCC-SLP, [facility] Speech-Language Pathologist Rehabilitation Services Office [phone]    Reuel Derby 01/10/2021, 9:41 AM

## 2021-01-10 NOTE — Progress Notes (Incomplete)
Patient Name: Jaclyn Meadows Date of Encounter: 01/10/2021  Hospital Problem List     Active Problems:   Acute CHF (congestive heart failure) [facility])    Patient Profile     ***  Subjective   ***  Inpatient Medications    . ascorbic acid  500 mg Per Tube Daily  . B-complex with vitamin C  1 tablet Per Tube Daily  . Chlorhexidine Gluconate Cloth  6 each Topical Daily  . cholecalciferol  2,000 Units Per Tube Daily  . free water  200 mL Per Tube Q4H  . insulin aspart  0-15 Units Subcutaneous Q4H  . multivitamin with minerals  1 tablet Per Tube Daily  . sodium zirconium cyclosilicate  5 g Per Tube BID    Vital Signs    Vitals:   01/10/21 0400 01/10/21 0500 01/10/21 0600 01/10/21 0700  BP: 102/71 102/79 118/77 105/81  Pulse: 73 72 79 72  Resp: 14 10 12 12   Temp:      TempSrc:      SpO2: 99% 98% 99% 98%  Weight:  53.1 kg    Height:        Intake/Output Summary (Last 24 hours) at 01/10/2021 0927 Last data filed at 01/10/2021 0700 Gross per 24 hour  Intake 1050.34 ml  Output 40 ml  Net 1010.34 ml   Filed Weights   01/08/21 0700 01/09/21 0500 01/10/21 0500  Weight: 53.9 kg 53.1 kg 53.1 kg    Physical Exam    GEN: Well nourished, well developed, in no acute distress.  HEENT: normal.  Neck: Supple, no JVD, carotid bruits, or masses. Cardiac: RRR, no murmurs, rubs, or gallops. No clubbing, cyanosis, edema.  Radials/DP/PT 2+ and equal bilaterally.  Respiratory:  Respirations regular and unlabored, clear to auscultation bilaterally. GI: Soft, nontender, nondistended, BS + x 4. MS: no deformity or atrophy. Skin: warm and dry, no rash. Neuro:  Strength and sensation are intact. Psych: Normal affect.  Labs    CBC Recent Labs    01/08/21 0229 01/09/21 0412 01/10/21 0442  WBC 11.7* 17.9* 19.6*  NEUTROABS 10.4* 15.5*  --   HGB 14.3 16.9* 14.7  HCT 41.5 48.1* 40.9  MCV 90.8 90.4 89.7  PLT 162 143* 125*   Basic Metabolic Panel Recent Labs    03/12/21 1101  01/09/21 0412 01/09/21 2338 01/10/21 0442  NA 128* 129* 129* 130*  K 4.8 5.4* 5.9* 5.4*  CL 88* 90* 88* 90*  CO2 23 20* 19* 21*  GLUCOSE 290* 72 121* 132*  BUN 55* 61* 73* 74*  CREATININE 1.04* 1.34* 1.85* 1.96*  CALCIUM 9.8 10.1 9.6 9.5  MG 2.5* 2.6*  --   --   PHOS  --  5.1*  --   --    Liver Function Tests No results for input(s): AST, ALT, ALKPHOS, BILITOT, PROT, ALBUMIN in the last 72 hours. No results for input(s): LIPASE, AMYLASE in the last 72 hours. Cardiac Enzymes No results for input(s): CKTOTAL, CKMB, CKMBINDEX, TROPONINI in the last 72 hours. BNP Recent Labs    01/08/21 0235 01/09/21 0412  BNP 3,916.8* >4,500.0*   D-Dimer No results for input(s): DDIMER in the last 72 hours. Hemoglobin A1C Recent Labs    01/08/21 0521  HGBA1C 7.2*   Fasting Lipid Panel Recent Labs    01/10/21 0442  CHOL 161  HDL 36*  LDLCALC 109*  TRIG 79  CHOLHDL 4.5   Thyroid Function Tests Recent Labs    01/09/21 0412  TSH 4.580*    Telemetry    ***  ECG    ***  Radiology    DG Chest 1 View  Result Date: 01/09/2021 CLINICAL DATA:  Shortness of breath and weakness EXAM: CHEST  1 VIEW COMPARISON:  01/08/2021 FINDINGS: Cardiomegaly, bilateral pleural effusions and bilateral LOWER lung atelectasis/consolidation noted. There is no evidence of pneumothorax. Mild pulmonary vascular congestion is present. No acute bony abnormalities identified. IMPRESSION: Cardiomegaly, bilateral pleural effusions and bilateral LOWER lung atelectasis/consolidation. Electronically Signed   By: Harmon Pier M.D.   On: 01/09/2021 18:09   DG Chest 1 View  Result Date: 01/09/2021 CLINICAL DATA:  Shortness of breath and feeding tube placement. EXAM: CHEST  1 VIEW COMPARISON:  01/09/2021 and prior studies FINDINGS: Cardiomegaly, bilateral LOWER lung atelectasis/consolidation and bilateral pleural effusions noted. No pneumothorax identified. A small bore feeding tube with tip entering the stomach and  off the field of view noted. IMPRESSION: Cardiomegaly, bilateral LOWER lung atelectasis/consolidation and bilateral pleural effusions. Electronically Signed   By: Harmon Pier M.D.   On: 01/09/2021 18:08   DG Abd 1 View  Result Date: 01/10/2021 CLINICAL DATA:  Feeding tube placement EXAM: ABDOMEN - 1 VIEW COMPARISON:  01/09/2021 at 11:19 p.m. FINDINGS: Nasoenteric feeding tube has been slightly withdrawn with its tip now overlying the expected mid body of the stomach. Small right pleural effusion is present. Retrocardiac opacification representing atelectasis, infiltrate, or posteriorly layering pleural fluid again noted. Mild cardiomegaly noted. The visualized abdominal gas pattern is unremarkable. Pelvis excluded from view. IMPRESSION: Nasoenteric feeding tube tip within the mid body of the stomach. Electronically Signed   By: Helyn Numbers MD   On: 01/10/2021 01:01   DG Abd 1 View  Result Date: 01/09/2021 CLINICAL DATA:  Feeding tube placement. EXAM: ABDOMEN - 1 VIEW COMPARISON:  Chest and abdomen radiographs obtained earlier today. FINDINGS: Feeding tube tip in the upper pelvis on the left following a course for an elongated stomach. Normal bowel gas pattern. Previously noted enlarged cardiac silhouette and dense bibasilar atelectasis or pneumonia and pleural fluid. Lumbar and lower thoracic spine degenerative changes and mild dextroconvex scoliosis. IMPRESSION: 1. Feeding tube tip in the upper pelvis on the left following a course for an elongated stomach. 2. Stable dense bibasilar atelectasis or pneumonia and pleural fluid. Electronically Signed   By: Beckie Salts M.D.   On: 01/09/2021 23:31   CT HEAD WO CONTRAST  Result Date: 01/08/2021 CLINICAL DATA:  Altered mental status. EXAM: CT HEAD WITHOUT CONTRAST TECHNIQUE: Contiguous axial images were obtained from the base of the skull through the vertex without intravenous contrast. COMPARISON:  None. FINDINGS: Brain: Moderately enlarged ventricles and  subarachnoid spaces. Moderate patchy white matter low density in both cerebral hemispheres. No intracranial hemorrhage, mass lesion or CT evidence of acute infarction. Vascular: No hyperdense vessel or unexpected calcification. Skull: Normal. Negative for fracture or focal lesion. Sinuses/Orbits: Unremarkable. Other: None. IMPRESSION: 1. No acute abnormality. 2. Moderate diffuse cerebral and cerebellar atrophy. 3. Moderate chronic small vessel white matter ischemic changes in both cerebral hemispheres. Electronically Signed   By: Beckie Salts M.D.   On: 01/08/2021 16:58   MR BRAIN WO CONTRAST  Result Date: 01/09/2021 CLINICAL DATA:  Mental status change.  Weakness. EXAM: MRI HEAD WITHOUT CONTRAST TECHNIQUE: Multiplanar, multiecho pulse sequences of the brain and surrounding structures were obtained without intravenous contrast. COMPARISON:  CT head 01/08/2021 FINDINGS: Brain: Acute infarct involving most of the posterior limb internal capsule on the right. No other  areas of acute infarct. Mild white matter changes in the periventricular deep white matter bilaterally. No hemorrhage or mass. Mild ventricular enlargement due to atrophy Patient not able to hold still with significant motion degrading the images. 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 Mild chronic microvascular ischemic changes in the white matter. Mild atrophy. Electronically Signed   By: Marlan Palau M.D.   On: 01/09/2021 15:15   DG Chest Portable 1 View  Result Date: 01/08/2021 CLINICAL DATA:  Shortness of breath EXAM: PORTABLE CHEST 1 VIEW COMPARISON:  None. FINDINGS: Small moderate bilateral pleural effusions with basilar airspace disease. Cardiomegaly with aortic atherosclerosis. No pneumothorax. IMPRESSION: Small to moderate bilateral pleural effusions with basilar airspace disease,  atelectasis versus pneumonia. Cardiomegaly Electronically Signed   By: Jasmine Pang M.D.   On: 01/08/2021 02:56   [place] EKG SITE RITE  Result Date: 01/09/2021 If Site Rite image not attached, placement could not be confirmed due to current cardiac rhythm.   Assessment & Plan    ***  Signed, Darlin Priestly. Fath MD 01/10/2021, 9:27 AM  Pager: [phone]

## 2021-01-10 NOTE — Progress Notes (Signed)
OT Cancellation Note  Patient Details Name: Jaclyn Meadows MRN: [id number] DOB: 10-22-1940   Cancelled Treatment:    Reason Eval/Treat Not Completed: Fatigue/lethargy limiting ability to participate. Per nursing request hold OT evaluation this date secondary to pt's lethargy/decreased level of alertness. Per chart review, noted plan goals of care meeting later this date. Will attempt to see pt at a future date/time as medically appropriate.  Kathie Dike, M.S. OTR/L  01/10/21, 1:44 PM  ascom [phone]

## 2021-01-10 NOTE — Consult Note (Signed)
ANTICOAGULATION CONSULT NOTE  Pharmacy Consult for IV Heparin Indication: atrial fibrillation  Patient Measurements: Heparin Dosing Weight: 54.4 kg  Labs: Recent Labs    01/08/21 0229 01/08/21 0521 01/08/21 0637 01/08/21 1101 01/09/21 0412 01/09/21 1227 01/09/21 2338 01/10/21 0442 01/10/21 1030  HGB 14.3  --   --   --  16.9*  --   --  14.7  --   HCT 41.5  --   --   --  48.1*  --   --  40.9  --   PLT 162  --   --   --  143*  --   --  125*  --   APTT  --   --   --   --   --  35 136*  --   --   LABPROT  --   --   --   --   --  25.1*  --   --   --   INR  --   --   --   --   --  2.4*  --   --   --   HEPARINUNFRC  --   --   --   --   --  1.05*  --  1.53*  --   CREATININE 1.03*   < >  --    < > 1.34*  --  1.85* 1.96* 2.07*  TROPONINIHS 78*  --  77*  --   --   --   --   --   --    < > = values in this interval not displayed.    Estimated Creatinine Clearance: 18 mL/min (A) (by C-G formula based on SCr of 2.07 mg/dL (H)).   Medical History: Past Medical History:  Diagnosis Date  . CAD (coronary artery disease)   . Hypertension     Medications:  No anticoagulation PTA Apixaban 5 mg BID (new) started 4/9 - last dose 4/9 at 2222  Assessment: Patient is a 80 y/o F with medical history including CAD, HFrEF with EF 20%, HTN who presented to the ED 4/9 with weakness and was ultimately admitted with acute CHF exacerbation and new-onset Afib with RVR. Cardiology is following. Patient is unable to take PO at this time. Thus pharmacy has been consulted to transition patient from apixaban to IV heparin for Afib.   Goal of Therapy:  aPTT 66 - 102 seconds Monitor platelets by anticoagulation protocol: Yes    Plan:   aPTT therapeutic: continue heparin infusion at 600 units/hr  recheck aPTT in 6 hours   Daily CBC per protocol while on IV heparin  Burnis Medin, PharmD 01/10/2021 11:15 AM

## 2021-01-10 NOTE — Progress Notes (Signed)
Initial Nutrition Assessment  DOCUMENTATION CODES:   Non-severe (moderate) malnutrition in context of chronic illness  INTERVENTION:   Initiate Osmolite 1.2 '@55ml' /hr  Free water flushes 176m q4 hours   Regimen provides 1584kcal/day, 73g/day protein and 16870mday of free water   Pt at high refeed risk; recommend monitor potassium, magnesium and phosphorus labs daily until stable  NUTRITION DIAGNOSIS:   Moderate Malnutrition related to chronic illness (CHF) as evidenced by mild fat depletion,moderate fat depletion,mild muscle depletion,moderate muscle depletion.  GOAL:   Patient will meet greater than or equal to 90% of their needs  MONITOR:   Labs,Weight trends,Skin,I & O's,TF tolerance  REASON FOR ASSESSMENT:   Consult Enteral/tube feeding initiation and management  ASSESSMENT:   [age].o. 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 Pt unable to provide any nutrition related history r/t AMS. Pt with nasogastric tube in place and tube feeds running at [id number]. Per chart, pt is down 15lbs(11%) over the past 8 months; this is significant weight loss. RD suspects pt with poor appetite and oral intake pta. Pt is at high refeed risk.   Medications reviewed and include: ascorbic, B-complex w/ C, vitamin D, insulin, MVI, lokelma, cefepime, pepcid, heparin, metronidazole, vancomycin   Labs reviewed: Na 129(L), BUN 77(H), creat 2.07(H) Wbc- 19.6(H)  NUTRITION - FOCUSED PHYSICAL EXAM:  Flowsheet Row Most Recent Value  Orbital Region No depletion  Upper Arm Region Moderate depletion  Thoracic and Lumbar Region Moderate depletion  Buccal Region No depletion  Temple Region Mild depletion  Clavicle Bone Region Moderate depletion  Clavicle and  Acromion Bone Region Moderate depletion  Scapular Bone Region Moderate depletion  Dorsal Hand Moderate depletion  Patellar Region Moderate depletion  Anterior Thigh Region Moderate depletion  Posterior Calf Region Moderate depletion  Edema (RD Assessment) Moderate  Hair Reviewed  Eyes Reviewed  Mouth Reviewed  Skin Reviewed  Nails Reviewed     Diet Order:   Diet Order            Diet NPO time specified  Diet effective now                EDUCATION NEEDS:   No education needs have been identified at this time  Skin:  Skin Assessment: Reviewed RN Assessment  Last BM:  4/10- type 5  Height:   Ht Readings from Last 1 Encounters:  01/08/21 '5\' 2"'  (1.575 m)    Weight:   Wt Readings from Last 1 Encounters:  01/10/21 53.1 kg    Ideal Body Weight:  50 kg  BMI:  Body mass index is 21.41 kg/m.  Estimated Nutritional Needs:   Kcal:  1400-1600kcal/day  Protein:  70-80g/day  Fluid:  1.4-1.6L/day  CasKoleen Distance, RD, LDN Please refer to [facility] and/or RD on-call/weekend/after hours pager

## 2021-01-10 NOTE — Progress Notes (Signed)
DHT pulled back an additional 6 cm, while xray tech in room. DHT at 55cm

## 2021-01-10 NOTE — Progress Notes (Signed)
PT Cancellation Note  Patient Details Name: Jaclyn Meadows MRN: [id number] DOB: August 15, 1941   Cancelled Treatment:    Reason Eval/Treat Not Completed: Fatigue/lethargy limiting ability to participate.  Per nursing request PT evaluation held this date secondary to pt's lethargy/decreased level of alertness.  Per nursing pt not appropriate for PT evaluation at this time.  Will attempt to see pt at a future date/time as medically appropriate.     Ovidio Hanger PT, DPT 01/10/21, 1:34 PM

## 2021-01-10 NOTE — Progress Notes (Signed)
Pt transferred to [id number]. Pt minimally responsive to pain. IVs intact. Report called to Annice Pih, Charity fundraiser. Personal belongings transferred with Pt

## 2021-01-10 NOTE — Progress Notes (Signed)
PROGRESS NOTE    Jaclyn Meadows  SWN:[id number] DOB: [birth year] DOA: 01/08/2021 PCP: Jola Baptist, MD    Brief Narrative:  80 year old female with history of coronary artery disease, chronic systolic heart failure with known ejection fraction less than 20%, hypertension presented to the emergency room with acute onset of generalized weakness, confusion that was worsening for last few days.  Also reportedly having progressive dyspnea and lower extremity edema.  In the emergency room, she was found to be with A. fib RVR, respiratory 20.  BNP 3916.  WBC 11.7.  Covid and influenza negative.  EKG showed rapid A. fib.  Chest x-ray showed small to moderate bilateral pleural effusion and cardiomegaly.  Started on amiodarone drip and IV Lasix and admitted to stepdown unit. Patient remained persistently encephalopathic, a CT scan of the head on 4/9 and blood gas were normal. With patient's worsening encephalopathy, left-sided neglect, MRI of the brain was done that showed acute ischemic infarction right MCA territory. Patient remains in overall very poor clinical status.  Not eating.  Treating with amiodarone, heparin, antibiotics and also having palliative care discussions.   Assessment & Plan:   Active Problems:   Acute CHF (congestive heart failure) (HCC)   Malnutrition of moderate degree  Acute on chronic systolic congestive heart failure:  Patient with significant and severe congestive heart failure with known ejection fraction less than 20%.  Repeat echocardiogram today.  Treated with IV Lasix.  Patient developed worsening mental status, lactic acidosis.  Lasix on hold.  Urine output is not adequate.  See discussion below for goal of care.   Paroxysmal A. fib with RVR:  Loaded with amiodarone and now remains on maintenance amiodarone.  Not taking adequate oral medications.  We will keep on amiodarone maintenance doses. Not able to take by mouth, Eliquis is stopped, heparin started. Followed  by cardiology. Recent cardiac cath with no significant coronary artery disease.  This is probably end-stage.  Altered mental status/acute metabolic encephalopathy: Patient is persistently lethargic.  Chest x-ray with likely consolidation right lower lobe, suspect aspiration pneumonia.  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  Her encephalopathy is probably multifactorial.  Supportive treatment.  Hypertension: Maintaining blood pressures.  Type 2 diabetes: On Jardiance at home, currently on sliding scale insulin.  Discontinue Jardiance.  Acute kidney injury: Probably secondary to #1.  Patient with progressive kidney dysfunction.  140 mL urine output last 24 hours.  BUN 77.  Acute ischemic stroke, right MCA with severe metabolic encephalopathy: Presented 4/8 evening, normal CT scan on 4/9.  Started on Eliquis on 4/8, changed to heparin today. Patient is not a TPA candidate due to already being on anticoagulation. She has severe underlying congestive heart failure with ejection fraction of 20%. With frailty and debility, likely poor recovery. Will discuss with neurology for further recommendations.  Patient currently remains on amiodarone for rate control and heparin for anticoagulation.  No evidence of bleeding.  Lactic acidosis: Lactic acid 6.  Procalcitonin 0.26.  Chest x-ray with fluid overload, possible right lower lobe infiltrate.  Suspect aspiration. Blood cultures, urine cultures collected. Started on broad-spectrum antibiotics.  We will continue today. Patient with ejection fraction 20%, cannot tolerate any IV fluids.  500 mL isotonic fluid today due to no urine output. Her blood pressures are adequate, currently no indication for vasopressor therapy. We will treat as severe sepsis, unable to resuscitate with fluid.  Goal of care discussion: Met with patient's two sons and daughter-in-law in  the meeting room 4/10.  We discussed in detail about  patient's very advanced frailty, altered mentation and poor chances of recovery, intolerance to therapeutics with severe congestive heart failure.  Updated different pathophysiology, rehab process if patient has to do good clinical recovery.  Decided to treat as full code and monitor overnight.  4/11, patient son called, updated and advised to come to the hospital for a family meeting.  Will meet. Also meet with palliative care.  DVT prophylaxis: Heparin infusion.   Code Status: Full code Family Communication: Son on the phone. Disposition Plan: Status is: Inpatient  Remains inpatient appropriate because:Altered mental status and IV treatments appropriate due to intensity of illness or inability to take PO   Dispo: The patient is from: Home              Anticipated d/c is to: Unknown at this time.  Anticipate hospice.              Patient currently is not medically stable to d/c.   Difficult to place patient No   Consultants:   Cardiology  Palliative care  Procedures:   None  Antimicrobials:  Anti-infectives (From admission, onward)   Start     Dose/Rate Route Frequency Ordered Stop   01/11/21 1800  vancomycin (VANCOREADY) IVPB 1000 mg/200 mL        1,000 mg 200 mL/hr over 60 Minutes Intravenous Every 48 hours 01/09/21 1746     01/10/21 1800  ceFEPIme (MAXIPIME) 2 g in sodium chloride 0.9 % 100 mL IVPB        2 g 200 mL/hr over 30 Minutes Intravenous Every 24 hours 01/09/21 1746     01/09/21 1730  ceFEPIme (MAXIPIME) 2 g in sodium chloride 0.9 % 100 mL IVPB        2 g 200 mL/hr over 30 Minutes Intravenous  Once 01/09/21 1632 01/09/21 1801   01/09/21 1730  metroNIDAZOLE (FLAGYL) IVPB 500 mg        500 mg 100 mL/hr over 60 Minutes Intravenous Every 8 hours 01/09/21 1632     01/09/21 1730  vancomycin (VANCOREADY) IVPB 1000 mg/200 mL  Status:  Discontinued        1,000 mg 200 mL/hr over 60 Minutes Intravenous  Once 01/09/21 1632 01/09/21 1641   01/09/21 1730  vancomycin  (VANCOREADY) IVPB 1250 mg/250 mL        1,250 mg 166.7 mL/hr over 90 Minutes Intravenous  Once 01/09/21 1641 01/09/21 2210        Subjective: Patient seen and examined.  Overnight remained about the same, lethargic and moaning.  Mostly sleepy.  Wakes up on a strong stimulation and looks on the right side.  Left side is flaccid.  Unable to keep up with conversation or keep open eyes. Remains in sinus rhythm and rate controlled. Tolerating tube feeding.  Objective: Vitals:   01/10/21 0400 01/10/21 0500 01/10/21 0600 01/10/21 0700  BP: 102/71 102/79 118/77 105/81  Pulse: 73 72 79 72  Resp: '14 10 12 12  ' Temp:      TempSrc:      SpO2: 99% 98% 99% 98%  Weight:  53.1 kg    Height:        Intake/Output Summary (Last 24 hours) at 01/10/2021 1210 Last data filed at 01/10/2021 0700 Gross per 24 hour  Intake 1050.34 ml  Output 40 ml  Net 1010.34 ml   Filed Weights   01/08/21 0700 01/09/21 0500 01/10/21 0500  Weight: 53.9 kg  53.1 kg 53.1 kg    Examination:  General exam: Sick looking, frail and debilitated.  On 2 L oxygen.  Very lethargic.  Difficult to keep awake. Respiratory system: Bilateral conducted airway sounds.  Poor air entry at bases. Cardiovascular system: S1 & S2 heard, RRR.  No edema. Gastrointestinal system: Abdomen is nondistended, soft and nontender. No organomegaly or masses felt. Normal bowel sounds heard. Foley catheter with 20 mL urine. Central nervous system: Sleepy.  Lethargic.  Wakes up to strong conversation and answers few appropriate questions and then goes back to sleep. Spontaneously moves right hand,  Does not move left side.   Data Reviewed: I have personally reviewed following labs and imaging studies  CBC: Recent Labs  Lab 01/08/21 0229 01/09/21 0412 01/10/21 0442  WBC 11.7* 17.9* 19.6*  NEUTROABS 10.4* 15.5*  --   HGB 14.3 16.9* 14.7  HCT 41.5 48.1* 40.9  MCV 90.8 90.4 89.7  PLT 162 143* 852*   Basic Metabolic Panel: Recent Labs   Lab 01/08/21 0637 01/08/21 1101 01/09/21 0412 01/09/21 2338 01/10/21 0442 01/10/21 1030  NA  --  128* 129* 129* 130* 129*  K  --  4.8 5.4* 5.9* 5.4* 5.1  CL  --  88* 90* 88* 90* 89*  CO2  --  23 20* 19* 21* 20*  GLUCOSE  --  290* 72 121* 132* 157*  BUN  --  55* 61* 73* 74* 77*  CREATININE  --  1.04* 1.34* 1.85* 1.96* 2.07*  CALCIUM  --  9.8 10.1 9.6 9.5 9.2  MG 2.8* 2.5* 2.6*  --   --   --   PHOS  --   --  5.1*  --   --   --    GFR: Estimated Creatinine Clearance: 18 mL/min (A) (by C-G formula based on SCr of 2.07 mg/dL (H)). Liver Function Tests: No results for input(s): AST, ALT, ALKPHOS, BILITOT, PROT, ALBUMIN in the last 168 hours. No results for input(s): LIPASE, AMYLASE in the last 168 hours. Recent Labs  Lab 01/09/21 1227  AMMONIA 27   Coagulation Profile: Recent Labs  Lab 01/09/21 1227  INR 2.4*   Cardiac Enzymes: No results for input(s): CKTOTAL, CKMB, CKMBINDEX, TROPONINI in the last 168 hours. BNP (last 3 results) No results for input(s): PROBNP in the last 8760 hours. HbA1C: Recent Labs    01/08/21 0521 01/10/21 0442  HGBA1C 7.2* 6.9*   CBG: Recent Labs  Lab 01/09/21 1538 01/09/21 2109 01/10/21 0010 01/10/21 0321 01/10/21 0731  GLUCAP 87 105* 127* 146* 150*   Lipid Profile: Recent Labs    01/10/21 0442  CHOL 161  HDL 36*  LDLCALC 109*  TRIG 79  CHOLHDL 4.5   Thyroid Function Tests: Recent Labs    01/09/21 0412  TSH 4.580*   Anemia Panel: Recent Labs    01/09/21 1227  VITAMINB12 >7,500*   Sepsis Labs: Recent Labs  Lab 01/09/21 1456 01/09/21 2257 01/10/21 0442  PROCALCITON 0.26  --  0.44  LATICACIDVEN 6.3* 4.4*  --     Recent Results (from the past 240 hour(s))  Resp Panel by RT-PCR (Flu A&B, Covid) Nasopharyngeal Swab     Status: None   Collection Time: 01/08/21  2:28 AM   Specimen: Nasopharyngeal Swab; Nasopharyngeal(NP) swabs in vial transport medium  Result Value Ref Range Status   SARS Coronavirus 2 by RT PCR  NEGATIVE NEGATIVE Final    Comment: (NOTE) SARS-CoV-2 target nucleic acids are NOT DETECTED.  The SARS-CoV-2 RNA is  generally detectable in upper respiratory specimens during the acute phase of infection. The lowest concentration of SARS-CoV-2 viral copies this assay can detect is 138 copies/mL. A negative result does not preclude SARS-Cov-2 infection and should not be used as the sole basis for treatment or other patient management decisions. A negative result may occur with  improper specimen collection/handling, submission of specimen other than nasopharyngeal swab, presence of viral mutation(s) within the areas targeted by this assay, and inadequate number of viral copies(<138 copies/mL). A negative result must be combined with clinical observations, patient history, and epidemiological information. The expected result is Negative.  Fact Sheet for Patients:  EntrepreneurPulse.com.au  Fact Sheet for Healthcare Providers:  IncredibleEmployment.be  This test is no t yet approved or cleared by the [place] FDA and  has been authorized for detection and/or diagnosis of SARS-CoV-2 by FDA under an Emergency Use Authorization (EUA). This EUA will remain  in effect (meaning this test can be used) for the duration of the COVID-19 declaration under Section 564(b)(1) of the Act, 21 U.S.C.section 360bbb-3(b)(1), unless the authorization is terminated  or revoked sooner.       Influenza A by PCR NEGATIVE NEGATIVE Final   Influenza B by PCR NEGATIVE NEGATIVE Final    Comment: (NOTE) The Xpert Xpress SARS-CoV-2/FLU/RSV plus assay is intended as an aid in the diagnosis of influenza from Nasopharyngeal swab specimens and should not be used as a sole basis for treatment. Nasal washings and aspirates are unacceptable for Xpert Xpress SARS-CoV-2/FLU/RSV testing.  Fact Sheet for Patients: EntrepreneurPulse.com.au  Fact Sheet for  Healthcare Providers: IncredibleEmployment.be  This test is not yet approved or cleared by the [place] FDA and has been authorized for detection and/or diagnosis of SARS-CoV-2 by FDA under an Emergency Use Authorization (EUA). This EUA will remain in effect (meaning this test can be used) for the duration of the COVID-19 declaration under Section 564(b)(1) of the Act, 21 U.S.C. section 360bbb-3(b)(1), unless the authorization is terminated or revoked.  Performed at [facility], [address] [place], [place] [address]   MRSA PCR Screening     Status: None   Collection Time: 01/08/21  4:34 AM   Specimen: Nasal Mucosa; Nasopharyngeal  Result Value Ref Range Status   MRSA by PCR NEGATIVE NEGATIVE Final    Comment:        The GeneXpert MRSA Assay (FDA approved for NASAL specimens only), is one component of a comprehensive MRSA colonization surveillance program. It is not intended to diagnose MRSA infection nor to guide or monitor treatment for MRSA infections. Performed at [facility], [address] [place], [place] [address]   CULTURE, BLOOD (ROUTINE X 2) w Reflex to ID Panel     Status: None (Preliminary result)   Collection Time: 01/09/21  2:57 PM   Specimen: BLOOD  Result Value Ref Range Status   Specimen Description BLOOD BLOOD RIGHT HAND  Final   Special Requests   Final    BOTTLES DRAWN AEROBIC ONLY Blood Culture results may not be optimal due to an inadequate volume of blood received in culture bottles   Culture   Final    NO GROWTH < 24 HOURS Performed at [facility], [address] [place], [place] [address]    Report Status PENDING  Incomplete  CULTURE, BLOOD (ROUTINE X 2) w Reflex to ID Panel     Status: None (Preliminary result)   Collection Time: 01/09/21  3:20 PM   Specimen: BLOOD  Result Value Ref Range Status   Specimen Description BLOOD BLOOD LEFT ARM  Final   Special Requests   Final     BOTTLES DRAWN AEROBIC AND ANAEROBIC Blood Culture adequate volume   Culture   Final    NO GROWTH < 24 HOURS Performed at [facility], [address], [place] , [place] [address]    Report Status PENDING  Incomplete         Radiology Studies: DG Chest 1 View  Result Date: 01/09/2021 CLINICAL DATA:  Shortness of breath and weakness EXAM: CHEST  1 VIEW COMPARISON:  01/08/2021 FINDINGS: Cardiomegaly, bilateral pleural effusions and bilateral LOWER lung atelectasis/consolidation noted. There is no evidence of pneumothorax. Mild pulmonary vascular congestion is present. No acute bony abnormalities identified. IMPRESSION: Cardiomegaly, bilateral pleural effusions and bilateral LOWER lung atelectasis/consolidation. Electronically Signed   By: Margarette Canada M.D.   On: 01/09/2021 18:09   DG Chest 1 View  Result Date: 01/09/2021 CLINICAL DATA:  Shortness of breath and feeding tube placement. EXAM: CHEST  1 VIEW COMPARISON:  01/09/2021 and prior studies FINDINGS: Cardiomegaly, bilateral LOWER lung atelectasis/consolidation and bilateral pleural effusions noted. No pneumothorax identified. A small bore feeding tube with tip entering the stomach and off the field of view noted. IMPRESSION: Cardiomegaly, bilateral LOWER lung atelectasis/consolidation and bilateral pleural effusions. Electronically Signed   By: Margarette Canada M.D.   On: 01/09/2021 18:08   DG Abd 1 View  Result Date: 01/10/2021 CLINICAL DATA:  Feeding tube placement EXAM: ABDOMEN - 1 VIEW COMPARISON:  01/09/2021 at 11:19 p.m. FINDINGS: Nasoenteric feeding tube has been slightly withdrawn with its tip now overlying the expected mid body of the stomach. Small right pleural effusion is present. Retrocardiac opacification representing atelectasis, infiltrate, or posteriorly layering pleural fluid again noted. Mild cardiomegaly noted. The visualized abdominal gas pattern is unremarkable. Pelvis excluded from view. IMPRESSION: Nasoenteric  feeding tube tip within the mid body of the stomach. Electronically Signed   By: Fidela Salisbury MD   On: 01/10/2021 01:01   DG Abd 1 View  Result Date: 01/09/2021 CLINICAL DATA:  Feeding tube placement. EXAM: ABDOMEN - 1 VIEW COMPARISON:  Chest and abdomen radiographs obtained earlier today. FINDINGS: Feeding tube tip in the upper pelvis on the left following a course for an elongated stomach. Normal bowel gas pattern. Previously noted enlarged cardiac silhouette and dense bibasilar atelectasis or pneumonia and pleural fluid. Lumbar and lower thoracic spine degenerative changes and mild dextroconvex scoliosis. IMPRESSION: 1. Feeding tube tip in the upper pelvis on the left following a course for an elongated stomach. 2. Stable dense bibasilar atelectasis or pneumonia and pleural fluid. Electronically Signed   By: Claudie Revering M.D.   On: 01/09/2021 23:31   CT HEAD WO CONTRAST  Result Date: 01/08/2021 CLINICAL DATA:  Altered mental status. EXAM: CT HEAD WITHOUT CONTRAST TECHNIQUE: Contiguous axial images were obtained from the base of the skull through the vertex without intravenous contrast. COMPARISON:  None. FINDINGS: Brain: Moderately enlarged ventricles and subarachnoid spaces. Moderate patchy white matter low density in both cerebral hemispheres. No intracranial hemorrhage, mass lesion or CT evidence of acute infarction. Vascular: No hyperdense vessel or unexpected calcification. Skull: Normal. Negative for fracture or focal lesion. Sinuses/Orbits: Unremarkable. Other: None. IMPRESSION: 1. No acute abnormality. 2. Moderate diffuse cerebral and cerebellar atrophy. 3. Moderate chronic small vessel white matter ischemic changes in both cerebral hemispheres. Electronically Signed   By: Claudie Revering M.D.   On: 01/08/2021 16:58   MR BRAIN  WO CONTRAST  Result Date: 01/09/2021 CLINICAL DATA:  Mental status change.  Weakness. EXAM: MRI HEAD WITHOUT CONTRAST TECHNIQUE: Multiplanar, multiecho pulse sequences of  the brain and surrounding structures were obtained without intravenous contrast. COMPARISON:  CT head 01/08/2021 FINDINGS: Brain: Acute infarct involving most of the posterior limb internal capsule on the right. No other areas of acute infarct. Mild white matter changes in the periventricular deep white matter bilaterally. No hemorrhage or mass. Mild ventricular enlargement due to atrophy Patient not able to hold still with significant motion degrading the images. 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 Mild chronic microvascular ischemic changes in the white matter. Mild atrophy. Electronically Signed   By: Franchot Gallo M.D.   On: 01/09/2021 15:15   [place] EKG SITE RITE  Result Date: 01/09/2021 If Site Rite image not attached, placement could not be confirmed due to current cardiac rhythm.       Scheduled Meds: . ascorbic acid  500 mg Per Tube Daily  . B-complex with vitamin C  1 tablet Per Tube Daily  . Chlorhexidine Gluconate Cloth  6 each Topical Daily  . cholecalciferol  2,000 Units Per Tube Daily  . free water  100 mL Per Tube Q4H  . insulin aspart  0-15 Units Subcutaneous Q4H  . multivitamin with minerals  1 tablet Per Tube Daily  . sodium zirconium cyclosilicate  5 g Per Tube BID   Continuous Infusions: . amiodarone 30 mg/hr (01/10/21 0700)  . ceFEPime (MAXIPIME) IV    . famotidine (PEPCID) IV 20 mg (01/10/21 0943)  . feeding supplement (OSMOLITE 1.2 CAL)    . heparin 600 Units/hr (01/10/21 0700)  . metronidazole 500 mg (01/10/21 0823)  . [START ON 01/11/2021] vancomycin       LOS: 2 days    Time spent: 35 minutes    Barb Merino, MD [facility] Pager [phone]

## 2021-01-11 DIAGNOSIS — R799 Abnormal finding of blood chemistry, unspecified: Secondary | ICD-10-CM | POA: Diagnosis not present

## 2021-01-11 DIAGNOSIS — R400 Somnolence: Secondary | ICD-10-CM | POA: Diagnosis not present

## 2021-01-11 DIAGNOSIS — I5023 Acute on chronic systolic (congestive) heart failure: Secondary | ICD-10-CM | POA: Diagnosis not present

## 2021-01-11 DIAGNOSIS — E440 Moderate protein-calorie malnutrition: Secondary | ICD-10-CM | POA: Diagnosis not present

## 2021-01-11 DIAGNOSIS — I4891 Unspecified atrial fibrillation: Secondary | ICD-10-CM | POA: Diagnosis not present

## 2021-01-11 DIAGNOSIS — I5021 Acute systolic (congestive) heart failure: Secondary | ICD-10-CM | POA: Diagnosis not present

## 2021-01-11 DIAGNOSIS — I639 Cerebral infarction, unspecified: Secondary | ICD-10-CM | POA: Diagnosis not present

## 2021-01-11 LAB — URINE CULTURE: Culture: NO GROWTH

## 2021-01-11 MED ORDER — SCOPOLAMINE 1 MG/3DAYS TD PT72
1.0000 | MEDICATED_PATCH | TRANSDERMAL | Status: DC
  Administered 2021-01-11: 1.5 mg via TRANSDERMAL
  Filled 2021-01-11: qty 1

## 2021-01-11 MED ORDER — GLYCOPYRROLATE 0.2 MG/ML IJ SOLN
0.3000 mg | Freq: Four times a day (QID) | INTRAMUSCULAR | Status: DC
  Administered 2021-01-11 – 2021-01-12 (×4): 0.3 mg via INTRAVENOUS
  Filled 2021-01-11 (×4): qty 2

## 2021-01-11 NOTE — Progress Notes (Signed)
PROGRESS NOTE    Jaclyn Meadows  ZHY:[id number] DOB: 1941/03/22 DOA: 01/08/2021 PCP: Jola Baptist, MD    Brief Narrative:  80 year old female with history of coronary artery disease, chronic systolic heart failure with known ejection fraction less than 20%, hypertension presented to the emergency room with acute onset of generalized weakness, confusion that was worsening for last few days.  Also reportedly having progressive dyspnea and lower extremity edema.  In the emergency room, she was found to be with A. fib RVR, respiratory 20.  BNP 3916.  WBC 11.7.  Covid and influenza negative.  EKG showed rapid A. fib.  Chest x-ray showed small to moderate bilateral pleural effusion and cardiomegaly.  Started on amiodarone drip and IV Lasix and admitted to stepdown unit. Patient remained persistently encephalopathic, a CT scan of the head on 4/9 and blood gas were normal. With patient's worsening encephalopathy, left-sided neglect, MRI of the brain was done that showed acute ischemic infarction right MCA territory. Patient remains in overall very poor clinical status.  Not eating.  Treating with amiodarone, heparin, antibiotics and also having palliative care discussions.  4/12 son at bedside. Pt unresponsive, somnolent.  Assessment & Plan:   Active Problems:   Acute CHF (congestive heart failure) (HCC)   Malnutrition of moderate degree  Acute on chronic systolic congestive heart failure:  Patient with significant and severe congestive heart failure with known ejection fraction less than 20%.  Repeat echocardiogram today.  Treated with IV Lasix.  Patient developed worsening mental status, lactic acidosis.  Lasix on hold.  Urine output is not adequate.  4/12 on comfort , will ask palliative to discus hospice home. See discussion below for goal of care.   Paroxysmal A. fib with RVR:  Loaded with amiodarone and now remains on maintenance amiodarone.  Not taking adequate oral medications.  We will  keep on amiodarone maintenance doses. Not able to take by mouth, Eliquis is stopped, heparin started. Followed by cardiology. Recent cardiac cath with no significant coronary artery disease.  This is probably end-stage. 4/12-plan for hospice house  Altered mental status/acute metabolic encephalopathy: Patient is persistently lethargic.  Chest x-ray with likely consolidation right lower lobe, suspect aspiration pneumonia.  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  Her encephalopathy is probably multifactorial.  Supportive treatment. 4/12plan for hospice house  Hypertension: stable  Type 2 diabetes: On Jardiance at home, currently on sliding scale insulin.  Discontinue Jardiance.  Acute kidney injury: Probably secondary to #1.  Patient with progressive kidney dysfunction.  140 mL urine output last 24 hours.  BUN 77.  Acute ischemic stroke, right MCA with severe metabolic encephalopathy: Presented 4/8 evening, normal CT scan on 4/9.  Started on Eliquis on 4/8, changed to heparin today. Patient is not a TPA candidate due to already being on anticoagulation. She has severe underlying congestive heart failure with ejection fraction of 20%. With frailty and debility, likely poor recovery. Will discuss with neurology for further recommendations.  Patient currently remains on amiodarone for rate control and heparin for anticoagulation.  No evidence of bleeding.  Lactic acidosis: Lactic acid 6.  Procalcitonin 0.26.  Chest x-ray with fluid overload, possible right lower lobe infiltrate.  Suspect aspiration. Blood cultures, urine cultures collected. Started on broad-spectrum antibiotics.  We will continue today. Patient with ejection fraction 20%, cannot tolerate any IV fluids.  500 mL isotonic fluid today due to no urine output. Her blood pressures are adequate, currently no indication for vasopressor therapy.  We will treat as severe sepsis, unable to resuscitate  with fluid.  Goal of care discussion: Met with patient's two sons and daughter-in-law in the meeting room 4/10.  We discussed in detail about patient's very advanced frailty, altered mentation and poor chances of recovery, intolerance to therapeutics with severe congestive heart failure.  Updated different pathophysiology, rehab process if patient has to do good clinical recovery.  Decided to treat as full code and monitor overnight.  4/11, patient son called, updated and advised to come to the hospital for a family meeting.  Will meet. Also meet with palliative care.  DVT prophylaxis: Heparin infusion.   Code Status: Full code Family Communication: Son at bedside Disposition Plan: Status is: Inpatient  Remains inpatient appropriate because:Altered mental status and IV treatments appropriate due to intensity of illness or inability to take PO   Dispo: The patient is from: Home              Anticipated d/c is to: hospice house in am              Patient currently is not medically stable to d/c.   Difficult to place patient No   Consultants:   Cardiology  Palliative care  Procedures:   None  Antimicrobials:  Anti-infectives (From admission, onward)   Start     Dose/Rate Route Frequency Ordered Stop   01/11/21 1800  vancomycin (VANCOREADY) IVPB 1000 mg/200 mL  Status:  Discontinued        1,000 mg 200 mL/hr over 60 Minutes Intravenous Every 48 hours 01/09/21 1746 01/10/21 1220   01/10/21 1800  ceFEPIme (MAXIPIME) 2 g in sodium chloride 0.9 % 100 mL IVPB  Status:  Discontinued        2 g 200 mL/hr over 30 Minutes Intravenous Every 24 hours 01/09/21 1746 01/10/21 2027   01/09/21 1730  ceFEPIme (MAXIPIME) 2 g in sodium chloride 0.9 % 100 mL IVPB        2 g 200 mL/hr over 30 Minutes Intravenous  Once 01/09/21 1632 01/09/21 1801   01/09/21 1730  metroNIDAZOLE (FLAGYL) IVPB 500 mg  Status:  Discontinued        500 mg 100 mL/hr over 60 Minutes Intravenous Every 8 hours 01/09/21  1632 01/10/21 1529   01/09/21 1730  vancomycin (VANCOREADY) IVPB 1000 mg/200 mL  Status:  Discontinued        1,000 mg 200 mL/hr over 60 Minutes Intravenous  Once 01/09/21 1632 01/09/21 1641   01/09/21 1730  vancomycin (VANCOREADY) IVPB 1250 mg/250 mL        1,250 mg 166.7 mL/hr over 90 Minutes Intravenous  Once 01/09/21 1641 01/09/21 2210       Subjective: Pt examined. Son at bedside. unresponsive.  Objective: Vitals:   01/10/21 1306 01/10/21 1510 01/10/21 2137 01/11/21 0632  BP: 94/83  135/84 129/84  Pulse: 72  79 89  Resp: 14  (!) 8 10  Temp:   97.8 F (36.6 C) 97.6 F (36.4 C)  TempSrc:   Oral Oral  SpO2: 100% 100% (!) 85% 94%  Weight:      Height:        Intake/Output Summary (Last 24 hours) at 01/11/2021 1407 Last data filed at 01/11/2021 1540 Gross per 24 hour  Intake 243.98 ml  Output 31 ml  Net 212.98 ml   Filed Weights   01/08/21 0700 01/09/21 0500 01/10/21 0500  Weight: 53.9 kg 53.1 kg 53.1 kg    Examination: Pale, frail rhonchorus  no wheezing Regular s1/s2 no gallop Soft benign +bs No edema Foley in place with minimal urine   Data Reviewed: I have personally reviewed following labs and imaging studies  CBC: Recent Labs  Lab 01/08/21 0229 01/09/21 0412 01/10/21 0442  WBC 11.7* 17.9* 19.6*  NEUTROABS 10.4* 15.5*  --   HGB 14.3 16.9* 14.7  HCT 41.5 48.1* 40.9  MCV 90.8 90.4 89.7  PLT 162 143* 696*   Basic Metabolic Panel: Recent Labs  Lab 01/08/21 0637 01/08/21 1101 01/09/21 0412 01/09/21 2338 01/10/21 0442 01/10/21 1030  NA  --  128* 129* 129* 130* 129*  K  --  4.8 5.4* 5.9* 5.4* 5.1  CL  --  88* 90* 88* 90* 89*  CO2  --  23 20* 19* 21* 20*  GLUCOSE  --  290* 72 121* 132* 157*  BUN  --  55* 61* 73* 74* 77*  CREATININE  --  1.04* 1.34* 1.85* 1.96* 2.07*  CALCIUM  --  9.8 10.1 9.6 9.5 9.2  MG 2.8* 2.5* 2.6*  --   --   --   PHOS  --   --  5.1*  --   --   --    GFR: Estimated Creatinine Clearance: 18 mL/min (A) (by C-G formula  based on SCr of 2.07 mg/dL (H)). Liver Function Tests: No results for input(s): AST, ALT, ALKPHOS, BILITOT, PROT, ALBUMIN in the last 168 hours. No results for input(s): LIPASE, AMYLASE in the last 168 hours. Recent Labs  Lab 01/09/21 1227  AMMONIA 27   Coagulation Profile: Recent Labs  Lab 01/09/21 1227  INR 2.4*   Cardiac Enzymes: No results for input(s): CKTOTAL, CKMB, CKMBINDEX, TROPONINI in the last 168 hours. BNP (last 3 results) No results for input(s): PROBNP in the last 8760 hours. HbA1C: Recent Labs    01/10/21 0442  HGBA1C 6.9*   CBG: Recent Labs  Lab 01/09/21 2109 01/10/21 0010 01/10/21 0321 01/10/21 0731 01/10/21 1222  GLUCAP 105* 127* 146* 150* 160*   Lipid Profile: Recent Labs    01/10/21 0442  CHOL 161  HDL 36*  LDLCALC 109*  TRIG 79  CHOLHDL 4.5   Thyroid Function Tests: Recent Labs    01/09/21 0412  TSH 4.580*   Anemia Panel: Recent Labs    01/09/21 1227  VITAMINB12 >7,500*   Sepsis Labs: Recent Labs  Lab 01/09/21 1456 01/09/21 2257 01/10/21 0442  PROCALCITON 0.26  --  0.44  LATICACIDVEN 6.3* 4.4*  --     Recent Results (from the past 240 hour(s))  Resp Panel by RT-PCR (Flu A&B, Covid) Nasopharyngeal Swab     Status: None   Collection Time: 01/08/21  2:28 AM   Specimen: Nasopharyngeal Swab; Nasopharyngeal(NP) swabs in vial transport medium  Result Value Ref Range Status   SARS Coronavirus 2 by RT PCR NEGATIVE NEGATIVE Final    Comment: (NOTE) SARS-CoV-2 target nucleic acids are NOT DETECTED.  The SARS-CoV-2 RNA is generally detectable in upper respiratory specimens during the acute phase of infection. The lowest concentration of SARS-CoV-2 viral copies this assay can detect is 138 copies/mL. A negative result does not preclude SARS-Cov-2 infection and should not be used as the sole basis for treatment or other patient management decisions. A negative result may occur with  improper specimen collection/handling,  submission of specimen other than nasopharyngeal swab, presence of viral mutation(s) within the areas targeted by this assay, and inadequate number of viral copies(<138 copies/mL). A negative result must be combined with clinical  observations, patient history, and epidemiological information. The expected result is Negative.  Fact Sheet for Patients:  EntrepreneurPulse.com.au  Fact Sheet for Healthcare Providers:  IncredibleEmployment.be  This test is no t yet approved or cleared by the [place] FDA and  has been authorized for detection and/or diagnosis of SARS-CoV-2 by FDA under an Emergency Use Authorization (EUA). This EUA will remain  in effect (meaning this test can be used) for the duration of the COVID-19 declaration under Section 564(b)(1) of the Act, 21 U.S.C.section 360bbb-3(b)(1), unless the authorization is terminated  or revoked sooner.       Influenza A by PCR NEGATIVE NEGATIVE Final   Influenza B by PCR NEGATIVE NEGATIVE Final    Comment: (NOTE) The Xpert Xpress SARS-CoV-2/FLU/RSV plus assay is intended as an aid in the diagnosis of influenza from Nasopharyngeal swab specimens and should not be used as a sole basis for treatment. Nasal washings and aspirates are unacceptable for Xpert Xpress SARS-CoV-2/FLU/RSV testing.  Fact Sheet for Patients: EntrepreneurPulse.com.au  Fact Sheet for Healthcare Providers: IncredibleEmployment.be  This test is not yet approved or cleared by the [place] FDA and has been authorized for detection and/or diagnosis of SARS-CoV-2 by FDA under an Emergency Use Authorization (EUA). This EUA will remain in effect (meaning this test can be used) for the duration of the COVID-19 declaration under Section 564(b)(1) of the Act, 21 U.S.C. section 360bbb-3(b)(1), unless the authorization is terminated or revoked.  Performed at [facility], [address] [place], [place] [address]   MRSA PCR Screening     Status: None   Collection Time: 01/08/21  4:34 AM   Specimen: Nasal Mucosa; Nasopharyngeal  Result Value Ref Range Status   MRSA by PCR NEGATIVE NEGATIVE Final    Comment:        The GeneXpert MRSA Assay (FDA approved for NASAL specimens only), is one component of a comprehensive MRSA colonization surveillance program. It is not intended to diagnose MRSA infection nor to guide or monitor treatment for MRSA infections. Performed at [facility], [address] [place], [place] [address]   CULTURE, BLOOD (ROUTINE X 2) w Reflex to ID Panel     Status: None (Preliminary result)   Collection Time: 01/09/21  2:57 PM   Specimen: BLOOD  Result Value Ref Range Status   Specimen Description BLOOD BLOOD RIGHT HAND  Final   Special Requests   Final    BOTTLES DRAWN AEROBIC ONLY Blood Culture results may not be optimal due to an inadequate volume of blood received in culture bottles   Culture   Final    NO GROWTH 2 DAYS Performed at [facility], [address], [place], [place] [address]    Report Status PENDING  Incomplete  CULTURE, BLOOD (ROUTINE X 2) w Reflex to ID Panel     Status: None (Preliminary result)   Collection Time: 01/09/21  3:20 PM   Specimen: BLOOD  Result Value Ref Range Status   Specimen Description BLOOD BLOOD LEFT ARM  Final   Special Requests   Final    BOTTLES DRAWN AEROBIC AND ANAEROBIC Blood Culture adequate volume   Culture   Final    NO GROWTH 2 DAYS Performed at [facility], [address] [place], [place] [address]    Report Status PENDING  Incomplete  Urine Culture     Status: None   Collection Time: 01/09/21  4:00 PM   Specimen: Urine, Random  Result Value Ref Range  Status   Specimen Description   Final    URINE, RANDOM Performed at [facility], [address], [place], [place] [address]    Special Requests   Final    NONE Performed at  [facility], [address] [place], [place] [address]    Culture   Final    NO GROWTH Performed at [facility], [place] [address] [place], [place] [address]    Report Status 01/11/2021 FINAL  Final         Radiology Studies: DG Chest 1 View  Result Date: 01/09/2021 CLINICAL DATA:  Shortness of breath and weakness EXAM: CHEST  1 VIEW COMPARISON:  01/08/2021 FINDINGS: Cardiomegaly, bilateral pleural effusions and bilateral LOWER lung atelectasis/consolidation noted. There is no evidence of pneumothorax. Mild pulmonary vascular congestion is present. No acute bony abnormalities identified. IMPRESSION: Cardiomegaly, bilateral pleural effusions and bilateral LOWER lung atelectasis/consolidation. Electronically Signed   By: Margarette Canada M.D.   On: 01/09/2021 18:09   DG Chest 1 View  Result Date: 01/09/2021 CLINICAL DATA:  Shortness of breath and feeding tube placement. EXAM: CHEST  1 VIEW COMPARISON:  01/09/2021 and prior studies FINDINGS: Cardiomegaly, bilateral LOWER lung atelectasis/consolidation and bilateral pleural effusions noted. No pneumothorax identified. A small bore feeding tube with tip entering the stomach and off the field of view noted. IMPRESSION: Cardiomegaly, bilateral LOWER lung atelectasis/consolidation and bilateral pleural effusions. Electronically Signed   By: Margarette Canada M.D.   On: 01/09/2021 18:08   DG Abd 1 View  Result Date: 01/10/2021 CLINICAL DATA:  Feeding tube placement EXAM: ABDOMEN - 1 VIEW COMPARISON:  01/09/2021 at 11:19 p.m. FINDINGS: Nasoenteric feeding tube has been slightly withdrawn with its tip now overlying the expected mid body of the stomach. Small right pleural effusion is present. Retrocardiac opacification representing atelectasis, infiltrate, or posteriorly layering pleural fluid again noted. Mild cardiomegaly noted. The visualized abdominal gas pattern is unremarkable. Pelvis excluded from view. IMPRESSION: Nasoenteric feeding tube  tip within the mid body of the stomach. Electronically Signed   By: Fidela Salisbury MD   On: 01/10/2021 01:01   DG Abd 1 View  Result Date: 01/09/2021 CLINICAL DATA:  Feeding tube placement. EXAM: ABDOMEN - 1 VIEW COMPARISON:  Chest and abdomen radiographs obtained earlier today. FINDINGS: Feeding tube tip in the upper pelvis on the left following a course for an elongated stomach. Normal bowel gas pattern. Previously noted enlarged cardiac silhouette and dense bibasilar atelectasis or pneumonia and pleural fluid. Lumbar and lower thoracic spine degenerative changes and mild dextroconvex scoliosis. IMPRESSION: 1. Feeding tube tip in the upper pelvis on the left following a course for an elongated stomach. 2. Stable dense bibasilar atelectasis or pneumonia and pleural fluid. Electronically Signed   By: Claudie Revering M.D.   On: 01/09/2021 23:31   MR BRAIN WO CONTRAST  Result Date: 01/09/2021 CLINICAL DATA:  Mental status change.  Weakness. EXAM: MRI HEAD WITHOUT CONTRAST TECHNIQUE: Multiplanar, multiecho pulse sequences of the brain and surrounding structures were obtained without intravenous contrast. COMPARISON:  CT head 01/08/2021 FINDINGS: Brain: Acute infarct involving most of the posterior limb internal capsule on the right. No other areas of acute infarct. Mild white matter changes in the periventricular deep white matter bilaterally. No hemorrhage or mass. Mild ventricular enlargement due to atrophy Patient not able to hold still with significant motion degrading the images. Vascular: Normal arterial flow voids Skull and upper cervical spine: Negative Sinuses/Orbits: Negative Other: None IMPRESSION: Acute infarct posterior limb  internal capsule on the right. Mild chronic microvascular ischemic changes in the white matter. Mild atrophy. Electronically Signed   By: Franchot Gallo M.D.   On: 01/09/2021 15:15        Scheduled Meds: . glycopyrrolate  0.3 mg Intravenous Q6H  . scopolamine  1 patch  Transdermal Q72H  . sodium chloride flush  10-40 mL Intracatheter Q12H   Continuous Infusions:    LOS: 3 days    Time spent: 35 minutes    Nolberto Hanlon, MD [facility] Pager [phone]

## 2021-01-11 NOTE — TOC Initial Note (Addendum)
Transition of Care [facility]) - Initial/Assessment Note    Patient Details  Name: Jaclyn Meadows MRN: [id number] Date of Birth: 09-Apr-1941  Transition of Care [facility]) CM/SW Contact:    Margarito Liner, LCSW Phone Number: 01/11/2021, 11:07 AM  Clinical Narrative:  Patient non-responsive. Son at bedside. CSW introduced role and inquired about interest in the [facility] and son confirmed. Referral made to Cyndra Numbers, RN with [facility].                5:02 pm: Boyd Kerbs requesting transport to be set up for 11:00 tomorrow. Sent information to [employer].  Expected Discharge Plan: Hospice Medical Facility Barriers to Discharge: Hospice Bed not available   Patient Goals and CMS Choice     Choice offered to / list presented to : Adult Children  Expected Discharge Plan and Services Expected Discharge Plan: Hospice Medical Facility       Living arrangements for the past 2 months: Single Family Home                                      Prior Living Arrangements/Services Living arrangements for the past 2 months: Single Family Home Lives with:: Adult Children Patient language and need for interpreter reviewed:: Yes        Need for Family Participation in Patient Care: Yes (Comment) Care giver support system in place?: Yes (comment)   Criminal Activity/Legal Involvement Pertinent to Current Situation/Hospitalization: No - Comment as needed  Activities of Daily Living Home Assistive Devices/Equipment: Cane (specify quad or straight),Wheelchair ADL Screening (condition at time of admission) Patient's cognitive ability adequate to safely complete daily activities?: Yes Is the patient deaf or have difficulty hearing?: Yes Does the patient have difficulty seeing, even when wearing glasses/contacts?: No Does the patient have difficulty concentrating, remembering, or making decisions?: No Patient able to express need for assistance with ADLs?:  Yes Does the patient have difficulty dressing or bathing?: No Independently performs ADLs?: Yes (appropriate for developmental age) Does the patient have difficulty walking or climbing stairs?: No Weakness of Legs: Both Weakness of Arms/Hands: None  Permission Sought/Granted Permission sought to share information with : Facility Contact Representative,Family Supports    Share Information with NAME: Bernette Seeman  Permission granted to share info w AGENCY: [facility]  Permission granted to share info w Relationship: Son  Permission granted to share info w Contact Information: [phone]  Emotional Assessment Appearance:: Appears stated age Attitude/Demeanor/Rapport: Unable to Assess Affect (typically observed): Unable to Assess Orientation: :  (Unresponsive) Alcohol / Substance Use: Not Applicable Psych Involvement: No (comment)  Admission diagnosis:  Hyponatremia [E87.1] Elevated BUN [R79.9] Demand ischemia (HCC) [I24.8] Acute CHF (congestive heart failure) (HCC) [I50.9] Acute on chronic systolic congestive heart failure (HCC) [I50.23] Acute respiratory failure with hypoxia (HCC) [J96.01] Atrial fibrillation with RVR (HCC) [I48.91] Acute congestive heart failure, unspecified heart failure type [facility]) [I50.9] Patient Active Problem List   Diagnosis Date Noted  . Malnutrition of moderate degree 01/10/2021  . Acute CHF (congestive heart failure) (HCC) 01/08/2021   PCP:  Zettie Pho, MD Pharmacy:   [id number] [place], [place] - [address] AT [facility] [address] [place] [address] Phone: [phone] Fax: [phone]     Social Determinants of Health (SDOH) Interventions    Readmission Risk Interventions No flowsheet  data found.

## 2021-01-11 NOTE — Progress Notes (Signed)
Patient resting in bed. Family at bedside. Earlier today family stated to me about the need for the tube in her nose. I stated to patient that before she was made comfort we used it for feedings. But now that she is comfort care we have stopped feedings and that we aren't using it. Hospice nurse went in as well and spoke to patient about tube. Around 1530 he stated that he was ok with [place] removing the tube. Notified Dr. Marylu Lund and he stated that patient's family wanted to keep in. I reiterated that they wanted the tube removed for comfort measures. No response given. Now daughter is in room and she stated to me when we will be removing the tube to help with her comfort. I stated to her that Dr. Marylu Lund would need to give [place] orders to remove. Sent him another message via secure chat but have had no response or new orders thus far. Patient resting in bed, low position. Have medicated accordingly for comfort. Daughter at bedside.

## 2021-01-11 NOTE — Progress Notes (Addendum)
[facility] Room 106 [employer], [employer] [facility]) Hospital Liaison RN note:  Received request from Mayra Reel, NP for family interest in Hospice Home. Chart reviewed and eligibility was approved. Spoke with son, [name] at bedside to confirm interest and explain services. He will complete registration paperwork once a bed becomes available. Unfortunately, Hospice Home does not have a room to offer today. Hospital care team is aware. [facility] Liaison will continue to monitor for room availability.   Please call with any hospice related questions or concerns.  Thank you for the opportunity to participate in this patient's care.  Cyndra Numbers, RN [facility] Liaison [phone]

## 2021-01-11 NOTE — Care Management Important Message (Signed)
Important Message  Patient Details  Name: Jaclyn Meadows MRN: [id number] Date of Birth: 03-09-1941   Medicare Important Message Given:  Other (see comment)  Patient is on comfort care and awaiting bed at [facility].  Out of respect for the patient and family no Important Message from [facility] given.  [name] A Taevin Mcferran 01/11/2021, 11:31 AM

## 2021-01-11 NOTE — Progress Notes (Signed)
Daily Progress Note   Patient Name: Jaclyn Meadows       Date: 01/11/2021 DOB: 1941/08/22  Age: 80 y.o. MRN#: [id number] Attending Physician: Lynn Ito, MD Primary Care Physician: Zettie Pho, MD Admit Date: 01/08/2021  Reason for Consultation/Follow-up: Non pain symptom management, Pain control, Psychosocial/spiritual support and Terminal Care  Subjective: Patient unresponsive. Some audible congestion noted. Coretrak remains in place but is clamped. Oral care provided.   Son [name] is at the bedside. Updates provided. We discussed at length end-of-life expectations. I provided education on nonverbal signs and changes in breathing patterns. [name] is aware of his mother's change in condition just in the past 24 hours. He is tearful. Emotional support provided.   We discussed at length removal of Coretrak tubing and no continued artificial feedings while hospitalized and if patient is able to transfer to the hospice home. He verbalizes understanding.   Created space allowing son to share memories of his mother. He is tearful expressing he was hopeful she would just wake up and tell him "all is well!" but he knows this will not happen. He shares his love for her and that his and his twin brother's birthday is next week. He is tearful in expressing he may be planning her funeral and not celebrating at that time and how he will miss her singing happy birthday to them. Emotional support provided.   [name] confirms he and his siblings wishes for continued comfort and hospice home transfer once bed is available. I discussed at length if patient's symptoms were not stable or she is not stable to transfer we may not consider transferring her to hospice if risk of death appears at state with transport. He verbalizes understanding and knows the medical team will continue to evaluate appropriateness.   He shares he has some family coming in from out of state and is hopeful she will survive.   All  questions answered and support provided.   Length of Stay: 3 days  Vital Signs: BP 129/84   Pulse 89   Temp 97.6 F (36.4 C) (Oral)   Resp 10   Ht 5\' 2"  (1.575 m)   Wt 53.1 kg   SpO2 94%   BMI 21.41 kg/m  SpO2: SpO2: 94 % O2 Device: O2 Device: Nasal Cannula O2 Flow Rate: O2 Flow Rate (L/min): 2 L/min  Physical Exam: Unresponsive Irregular Diminished bilateral, rhonchi, audible secretions           Palliative Care Assessment & Plan  HPI: Palliative Care consult requested for goals of care discussion in this 80 y.o. female with a medical history significant for hypertension, coronary artery disease, and CHF (EF 20%).  Patient presented to the ED via EMS with complaints of weakness and altered mental status.  Showed BUN 52, creatinine 1.03, BNP 3916, WBC 11.7.  Covid PCR negative.  EKG showed new onset atrial fibrillation with rapid RVR.  Chest x-ray showed small to moderate bilateral pleural effusions with bilateral airspace disease. This admission MRI shows right acute infarct. Neurology and cardiology following.  Code Status:  DNR  Goals of Care/Recommendations:  Continue with comfort care measures  Scheduled Robinul and Scopolamine patch for secretions  Pending hospice home transfer once bed is available and patient is stable in symptoms for transfer.   PMT will continue support and follow    Prognosis: Hours - Days  Discharge Planning: Hospice facility vs hospital death  Thank you for allowing the Palliative Medicine Team to assist in the  care of this patient.  Time Total: 40 min.   Visit consisted of counseling and education dealing with the complex and emotionally intense issues of symptom management and palliative care in the setting of serious and potentially life-threatening illness.Greater than 50%  of this time was spent counseling and coordinating care related to the above assessment and plan.  Willette Alma, AGPCNP-BC  Palliative Medicine  Team [phone]

## 2021-01-11 NOTE — Progress Notes (Signed)
Dr. Marylu Lund replied to secure chat stating if we still used the tube. I stated that we do not and gave orders to D/C tube. Went into patient room and D/C tube to right nare. Patient tolerated procedure. Daughter stayed at bedside.

## 2021-01-12 DIAGNOSIS — I5021 Acute systolic (congestive) heart failure: Secondary | ICD-10-CM | POA: Diagnosis not present

## 2021-01-12 DIAGNOSIS — E440 Moderate protein-calorie malnutrition: Secondary | ICD-10-CM | POA: Diagnosis not present

## 2021-01-12 MED ORDER — ONDANSETRON HCL 4 MG/2ML IJ SOLN
4.0000 mg | Freq: Four times a day (QID) | INTRAMUSCULAR | 0 refills | Status: AC | PRN
Start: 2021-01-12 — End: ?

## 2021-01-12 MED ORDER — GLYCOPYRROLATE 0.2 MG/ML IJ SOLN: 0.3000 mg | Freq: Four times a day (QID) | INTRAMUSCULAR | Status: AC

## 2021-01-12 MED ORDER — POLYVINYL ALCOHOL 1.4 % OP SOLN: 1.0000 [drp] | Freq: Four times a day (QID) | OPHTHALMIC | 0 refills | Status: AC | PRN

## 2021-01-12 MED ORDER — BIOTENE DRY MOUTH MT LIQD: 15.0000 mL | OROMUCOSAL | Status: AC | PRN

## 2021-01-12 MED ORDER — SCOPOLAMINE 1 MG/3DAYS TD PT72: 1.0000 | MEDICATED_PATCH | TRANSDERMAL | 12 refills | Status: AC

## 2021-01-12 NOTE — Progress Notes (Signed)
Called report over to [facility]. Gathered all items. Sending with foley and picc line to right upper arm. Bed in low position.

## 2021-01-12 NOTE — Progress Notes (Signed)
[facility] Room 106 [facility]) Hospital Liaison RN note:  Hospice Home is able to offer a room today. Patient's son, [name] has signed paperwork at the Hospice Home this morning and transport has been arranged for 11am. I have faxed the discharge summary to the Hospice Home.  Please call with any hospice related questions or concerns.  Thank you for the opportunity to participate in this patient's care.  Cyndra Numbers, RN [facility] Liaison  [phone]

## 2021-01-12 NOTE — TOC Transition Note (Signed)
Transition of Care [facility]) - CM/SW Discharge Note   Patient Details  Name: Jaclyn Meadows MRN: [id number] Date of Birth: 08/22/1941  Transition of [facility]) CM/SW Contact:  Margarito Liner, LCSW Phone Number: 2021/01/13, 9:45 AM   Clinical Narrative: Patient has orders to discharge to the [facility] today. RN will call report to the facility. [facility] set up for 11:00 per hospice liaison request. No further concerns. CSW signing off.    Final next level of care: Hospice Medical Facility Barriers to Discharge: Barriers Resolved   Patient Goals and CMS Choice     Choice offered to / list presented to : Adult Children  Discharge Placement              Patient chooses bed at: Other - please specify in the comment section below: [facility]) Patient to be transferred to facility by: [facility]   Patient and family notified of of transfer: 2021/01/13  Discharge Plan and Services                                     Social Determinants of Health (SDOH) Interventions     Readmission Risk Interventions No flowsheet data found.

## 2021-01-12 NOTE — Discharge Summary (Signed)
Jaclyn Meadows KGM:[id number] DOB: 05/24/1941 DOA: 01/08/2021  PCP: Zettie Pho, MD  Admit date: 01/08/2021 Discharge date: 11-Feb-2021  Admitted From: home Disposition:  Hospice hous  Recommendations for Outpatient Follow-up:  none      Discharge Condition:Stable CODE STATUS:DNR  Diet recommendation: regular  Brief/Interim Summary: PER HPI: Jaclyn Meadows is a 80 y.o. Caucasian female with medical history significant for coronary artery disease, CHF with EF of 20% and hypertension, who presented to the emergency room with acute onset generalized weakness with altered mental status for the last couple of days.  Patient admitted to worsening dyspnea and lower extremity edema.  She denies any cough or wheezing or hemoptysis.  No chest pain or palpitations.  Her Lasix dose has been recently changed due to CHF exacerbation per her son.     ED Course: Upon presentation to the emergency room heart rate was 124 and later 140, respiratory rate was 20 and later 26 with otherwise normal vital signs.  Labs revealed hyponatremia 129 hypochloremia of 89, hyperglycemia of 236, BUN of 52 with a creatinine of 1.03 anion gap of 17.  BNP was 3916.8 and high-sensitivity troponin I was 78.  CBC showed WBCs of 11.7 with neutrophilia.  Influenza antigens and COVID-19 PCR came back negative.   Was given iv amiodarone.  Patient was drowsy on admission Patient remained persistently encephalopathic   Acute on chronic systolic congestive heart failure:  Patient with significant and severe congestive heart failure with known ejection fraction less than 20%.  Cardiology was consulted.  Repeat echo was ordered. Per cardiology: TTE 1/4/22with EF 15-20% -pt hasas history of non compliance. LHC 08/27/2020 mild disease (30 to 40% proximal and middle LAD, 50% proximal D2 branch, mild luminal irregularity left circumflex.)   Paroxysmal A. fib with RVR:  Was started on amiodarone   A/c was stopped as she was not  taking po . Planned for hospice care   Altered mental status/acute metabolic encephalopathy: Patient is persistently lethargic.   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 Mild chronic microvascular ischemic changes in the white matter. Mild atrophy Pt with  left hemineglect and hemiparesis.  Her encephalopathy is probably multifactorial.   Neurology was consulted   Hypertension: stable  Type 2 diabetes:no po intake  Dc jardiance   Acute kidney injury: Probably secondary to #1.  Patient with progressive kidney dysfunction.      Lactic acidosis:Lactic acid 6. Procalcitonin 0.26. Chest x-ray with fluid overload, possible right lower lobe infiltrate. Suspect aspiration. Blood cultures,urine cultures negative. Was started on broad-spectrum antibiotics.     Discharge Diagnoses:  Active Problems:   Acute CHF (congestive heart failure) (HCC)   Malnutrition of moderate degree    Discharge Instructions   Allergies as of Feb 11, 2021      Reactions   Buckwheat Anaphylaxis   Flaxseed (linseed) Anaphylaxis   Okra Anaphylaxis   Other Anaphylaxis   Trout    Strawberry Extract Anaphylaxis   Epinephrine Other (See Comments)   Prolonged numbness   Neomycin Rash   Other reaction(s): UNKNOWN   Sulfur Dioxide Dermatitis   Other reaction(s): UNKNOWN   Azithromycin    Skin lesions   Erythromycin    Amoxicillin    dysuria      Medication List    STOP taking these medications   Astaxanthin 4 MG Caps   bumetanide 0.5 MG tablet Commonly known as: BUMEX   carvedilol 25 MG tablet Commonly known as: COREG   Cholecalciferol  50 MCG (2000 UT) Caps   Daily Value Multivitamin Tabs   Entresto 49-51 MG Generic drug: sacubitril-valsartan   Enzyme Digest Caps   furosemide 20 MG tablet Commonly known as: LASIX   Jardiance 10 MG Tabs tablet Generic drug: empagliflozin   L-Lysine 1000 MG Tabs   metoprolol succinate 100 MG 24 hr  tablet Commonly known as: TOPROL-XL   spironolactone 25 MG tablet Commonly known as: ALDACTONE   telmisartan 20 MG tablet Commonly known as: MICARDIS   valsartan 40 MG tablet Commonly known as: DIOVAN   VITAMIN B COMPLEX PO   Vitamin C CR 1000 MG Tbcr     TAKE these medications   antiseptic oral rinse Liqd Apply 15 mLs topically as needed for dry mouth.   glycopyrrolate 0.2 MG/ML injection Commonly known as: ROBINUL Inject 1.5 mLs (0.3 mg total) into the vein every 6 (six) hours.   ondansetron 4 MG/2ML Soln injection Commonly known as: ZOFRAN Inject 2 mLs (4 mg total) into the vein every 6 (six) hours as needed for nausea.   polyvinyl alcohol 1.4 % ophthalmic solution Commonly known as: LIQUIFILM TEARS Place 1 drop into both eyes 4 (four) times daily as needed for dry eyes.   scopolamine 1 MG/3DAYS Commonly known as: TRANSDERM-SCOP Place 1 patch (1.5 mg total) onto the skin every 3 (three) days. Start taking on: January 14, 2021       Allergies  Allergen Reactions  . Buckwheat Anaphylaxis  . Flaxseed (Linseed) Anaphylaxis  . Okra Anaphylaxis  . Other Anaphylaxis    Trout   . Strawberry Extract Anaphylaxis  . Epinephrine Other (See Comments)    Prolonged numbness  . Neomycin Rash    Other reaction(s): UNKNOWN  . Sulfur Dioxide Dermatitis    Other reaction(s): UNKNOWN  . Azithromycin     Skin lesions  . Erythromycin   . Amoxicillin     dysuria    Consultations:  Cardiology and neurology   Procedures/Studies: DG Chest 1 View  Result Date: 01/09/2021 CLINICAL DATA:  Shortness of breath and weakness EXAM: CHEST  1 VIEW COMPARISON:  01/08/2021 FINDINGS: Cardiomegaly, bilateral pleural effusions and bilateral LOWER lung atelectasis/consolidation noted. There is no evidence of pneumothorax. Mild pulmonary vascular congestion is present. No acute bony abnormalities identified. IMPRESSION: Cardiomegaly, bilateral pleural effusions and bilateral LOWER lung  atelectasis/consolidation. Electronically Signed   By: Harmon Pier M.D.   On: 01/09/2021 18:09   DG Chest 1 View  Result Date: 01/09/2021 CLINICAL DATA:  Shortness of breath and feeding tube placement. EXAM: CHEST  1 VIEW COMPARISON:  01/09/2021 and prior studies FINDINGS: Cardiomegaly, bilateral LOWER lung atelectasis/consolidation and bilateral pleural effusions noted. No pneumothorax identified. A small bore feeding tube with tip entering the stomach and off the field of view noted. IMPRESSION: Cardiomegaly, bilateral LOWER lung atelectasis/consolidation and bilateral pleural effusions. Electronically Signed   By: Harmon Pier M.D.   On: 01/09/2021 18:08   DG Abd 1 View  Result Date: 01/10/2021 CLINICAL DATA:  Feeding tube placement EXAM: ABDOMEN - 1 VIEW COMPARISON:  01/09/2021 at 11:19 p.m. FINDINGS: Nasoenteric feeding tube has been slightly withdrawn with its tip now overlying the expected mid body of the stomach. Small right pleural effusion is present. Retrocardiac opacification representing atelectasis, infiltrate, or posteriorly layering pleural fluid again noted. Mild cardiomegaly noted. The visualized abdominal gas pattern is unremarkable. Pelvis excluded from view. IMPRESSION: Nasoenteric feeding tube tip within the mid body of the stomach. Electronically Signed   By: Helyn Numbers  MD   On: 01/10/2021 01:01   DG Abd 1 View  Result Date: 01/09/2021 CLINICAL DATA:  Feeding tube placement. EXAM: ABDOMEN - 1 VIEW COMPARISON:  Chest and abdomen radiographs obtained earlier today. FINDINGS: Feeding tube tip in the upper pelvis on the left following a course for an elongated stomach. Normal bowel gas pattern. Previously noted enlarged cardiac silhouette and dense bibasilar atelectasis or pneumonia and pleural fluid. Lumbar and lower thoracic spine degenerative changes and mild dextroconvex scoliosis. IMPRESSION: 1. Feeding tube tip in the upper pelvis on the left following a course for an  elongated stomach. 2. Stable dense bibasilar atelectasis or pneumonia and pleural fluid. Electronically Signed   By: Beckie Salts M.D.   On: 01/09/2021 23:31   CT HEAD WO CONTRAST  Result Date: 01/08/2021 CLINICAL DATA:  Altered mental status. EXAM: CT HEAD WITHOUT CONTRAST TECHNIQUE: Contiguous axial images were obtained from the base of the skull through the vertex without intravenous contrast. COMPARISON:  None. FINDINGS: Brain: Moderately enlarged ventricles and subarachnoid spaces. Moderate patchy white matter low density in both cerebral hemispheres. No intracranial hemorrhage, mass lesion or CT evidence of acute infarction. Vascular: No hyperdense vessel or unexpected calcification. Skull: Normal. Negative for fracture or focal lesion. Sinuses/Orbits: Unremarkable. Other: None. IMPRESSION: 1. No acute abnormality. 2. Moderate diffuse cerebral and cerebellar atrophy. 3. Moderate chronic small vessel white matter ischemic changes in both cerebral hemispheres. Electronically Signed   By: Beckie Salts M.D.   On: 01/08/2021 16:58   MR BRAIN WO CONTRAST  Result Date: 01/09/2021 CLINICAL DATA:  Mental status change.  Weakness. EXAM: MRI HEAD WITHOUT CONTRAST TECHNIQUE: Multiplanar, multiecho pulse sequences of the brain and surrounding structures were obtained without intravenous contrast. COMPARISON:  CT head 01/08/2021 FINDINGS: Brain: Acute infarct involving most of the posterior limb internal capsule on the right. No other areas of acute infarct. Mild white matter changes in the periventricular deep white matter bilaterally. No hemorrhage or mass. Mild ventricular enlargement due to atrophy Patient not able to hold still with significant motion degrading the images. 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 Mild chronic microvascular ischemic changes in the white  matter. Mild atrophy. Electronically Signed   By: Marlan Palau M.D.   On: 01/09/2021 15:15   DG Chest Portable 1 View  Result Date: 01/08/2021 CLINICAL DATA:  Shortness of breath EXAM: PORTABLE CHEST 1 VIEW COMPARISON:  None. FINDINGS: Small moderate bilateral pleural effusions with basilar airspace disease. Cardiomegaly with aortic atherosclerosis. No pneumothorax. IMPRESSION: Small to moderate bilateral pleural effusions with basilar airspace disease, atelectasis versus pneumonia. Cardiomegaly Electronically Signed   By: Jasmine Pang M.D.   On: 01/08/2021 02:56   [place] EKG SITE RITE  Result Date: 01/09/2021 If Site Rite image not attached, placement could not be confirmed due to current cardiac rhythm.     Subjective: Lethargic, non responsiv  Discharge Exam: Vitals:   01/11/21 1950 Feb 06, 2021 0749  BP: (!) 112/59 (!) 91/48  Pulse: 83 76  Resp: 16 20  Temp: (!) 96.7 F (35.9 C) 97.7 F (36.5 C)  SpO2: (!) 87% 96%   Vitals:   01/10/21 2137 01/11/21 0632 01/11/21 1950 06-Feb-2021 0749  BP: 135/84 129/84 (!) 112/59 (!) 91/48  Pulse: 79 89 83 76  Resp: (!) Temp: 97.8 F (36.6 C) 97.6 F (36.4 C) (!) 96.7 F (35.9 C) 97.7 F (  36.5 C)  TempSrc: Oral Oral  Oral  SpO2: (!) 85% 94% (!) 87% 96%  Weight:      Height:        General: non responsive Cardiovascular: RRR, S1/S2 +, no rubs Respiratory: CTA bilaterally, no wheezing, no rhonchi Abdominal: Soft, NT, ND, bowel sounds decreased Extremities: no edema    The results of significant diagnostics from this hospitalization (including imaging, microbiology, ancillary and laboratory) are listed below for reference.     Microbiology: Recent Results (from the past 240 hour(s))  Resp Panel by RT-PCR (Flu A&B, Covid) Nasopharyngeal Swab     Status: None   Collection Time: 01/08/21  2:28 AM   Specimen: Nasopharyngeal Swab; Nasopharyngeal(NP) swabs in vial transport medium  Result Value Ref Range Status   SARS  Coronavirus 2 by RT PCR NEGATIVE NEGATIVE Final    Comment: (NOTE) SARS-CoV-2 target nucleic acids are NOT DETECTED.  The SARS-CoV-2 RNA is generally detectable in upper respiratory specimens during the acute phase of infection. The lowest concentration of SARS-CoV-2 viral copies this assay can detect is 138 copies/mL. A negative result does not preclude SARS-Cov-2 infection and should not be used as the sole basis for treatment or other patient management decisions. A negative result may occur with  improper specimen collection/handling, submission of specimen other than nasopharyngeal swab, presence of viral mutation(s) within the areas targeted by this assay, and inadequate number of viral copies(<138 copies/mL). A negative result must be combined with clinical observations, patient history, and epidemiological information. The expected result is Negative.  Fact Sheet for Patients:  BloggerCourse.comhttps://www.fda.gov/media/152166/download  Fact Sheet for Healthcare Providers:  SeriousBroker.ithttps://www.fda.gov/media/152162/download  This test is no t yet approved or cleared by the [place] FDA and  has been authorized for detection and/or diagnosis of SARS-CoV-2 by FDA under an Emergency Use Authorization (EUA). This EUA will remain  in effect (meaning this test can be used) for the duration of the COVID-19 declaration under Section 564(b)(1) of the Act, 21 U.S.C.section 360bbb-3(b)(1), unless the authorization is terminated  or revoked sooner.       Influenza A by PCR NEGATIVE NEGATIVE Final   Influenza B by PCR NEGATIVE NEGATIVE Final    Comment: (NOTE) The Xpert Xpress SARS-CoV-2/FLU/RSV plus assay is intended as an aid in the diagnosis of influenza from Nasopharyngeal swab specimens and should not be used as a sole basis for treatment. Nasal washings and aspirates are unacceptable for Xpert Xpress SARS-CoV-2/FLU/RSV testing.  Fact Sheet for  Patients: BloggerCourse.comhttps://www.fda.gov/media/152166/download  Fact Sheet for Healthcare Providers: SeriousBroker.ithttps://www.fda.gov/media/152162/download  This test is not yet approved or cleared by the [place] FDA and has been authorized for detection and/or diagnosis of SARS-CoV-2 by FDA under an Emergency Use Authorization (EUA). This EUA will remain in effect (meaning this test can be used) for the duration of the COVID-19 declaration under Section 564(b)(1) of the Act, 21 U.S.C. section 360bbb-3(b)(1), unless the authorization is terminated or revoked.  Performed at [facility], [address] [place], [place] [id number]   MRSA PCR Screening     Status: None   Collection Time: 01/08/21  4:34 AM   Specimen: Nasal Mucosa; Nasopharyngeal  Result Value Ref Range Status   MRSA by PCR NEGATIVE NEGATIVE Final    Comment:        The GeneXpert MRSA Assay (FDA approved for NASAL specimens only), is one component of a comprehensive MRSA colonization surveillance program. It is not intended to diagnose MRSA infection nor to guide or monitor treatment  for MRSA infections. Performed at [facility], [address] [place], [place] [address]   CULTURE, BLOOD (ROUTINE X 2) w Reflex to ID Panel     Status: None (Preliminary result)   Collection Time: 01/09/21  2:57 PM   Specimen: BLOOD  Result Value Ref Range Status   Specimen Description BLOOD BLOOD RIGHT HAND  Final   Special Requests   Final    BOTTLES DRAWN AEROBIC ONLY Blood Culture results may not be optimal due to an inadequate volume of blood received in culture bottles   Culture   Final    NO GROWTH 3 DAYS Performed at [facility], [address], [place], [place] [address]    Report Status PENDING  Incomplete  CULTURE, BLOOD (ROUTINE X 2) w Reflex to ID Panel     Status: None (Preliminary result)   Collection Time: 01/09/21  3:20 PM   Specimen: BLOOD  Result Value Ref Range Status   Specimen  Description BLOOD BLOOD LEFT ARM  Final   Special Requests   Final    BOTTLES DRAWN AEROBIC AND ANAEROBIC Blood Culture adequate volume   Culture   Final    NO GROWTH 3 DAYS Performed at [facility], [address] [place], [place] [address]    Report Status PENDING  Incomplete  Urine Culture     Status: None   Collection Time: 01/09/21  4:00 PM   Specimen: Urine, Random  Result Value Ref Range Status   Specimen Description   Final    URINE, RANDOM Performed at [facility], [address], [place], [place] [address]    Special Requests   Final    NONE Performed at [facility], [address] [place], [place] [address]    Culture   Final    NO GROWTH Performed at [facility], [address], [place], [place] [address]    Report Status 01/11/2021 FINAL  Final     Labs: BNP (last 3 results) Recent Labs    01/08/21 0235 01/09/21 0412  BNP 3,916.8* >4,500.0*   Basic Metabolic Panel: Recent Labs  Lab 01/08/21 0637 01/08/21 1101 01/09/21 0412 01/09/21 2338 01/10/21 0442 01/10/21 1030  NA  --  128* 129* 129* 130* 129*  K  --  4.8 5.4* 5.9* 5.4* 5.1  CL  --  88* 90* 88* 90* 89*  CO2  --  23 20* 19* 21* 20*  GLUCOSE  --  290* 72 121* 132* 157*  BUN  --  55* 61* 73* 74* 77*  CREATININE  --  1.04* 1.34* 1.85* 1.96* 2.07*  CALCIUM  --  9.8 10.1 9.6 9.5 9.2  MG 2.8* 2.5* 2.6*  --   --   --   PHOS  --   --  5.1*  --   --   --    Liver Function Tests: No results for input(s): AST, ALT, ALKPHOS, BILITOT, PROT, ALBUMIN in the last 168 hours. No results for input(s): LIPASE, AMYLASE in the last 168 hours. Recent Labs  Lab 01/09/21 1227  AMMONIA 27   CBC: Recent Labs  Lab 01/08/21 0229 01/09/21 0412 01/10/21 0442  WBC 11.7* 17.9* 19.6*  NEUTROABS 10.4* 15.5*  --   HGB 14.3 16.9* 14.7  HCT 41.5 48.1* 40.9  MCV 90.8 90.4 89.7  PLT 162 143* 125*   Cardiac Enzymes: No results for input(s): CKTOTAL, CKMB, CKMBINDEX,  TROPONINI in the last 168 hours. BNP: Invalid input(s): POCBNP  CBG: Recent Labs  Lab 01/09/21 2109 01/10/21 0010 01/10/21 0321 01/10/21 0731 01/10/21 1222  GLUCAP 105* 127* 146* 150* 160*   D-Dimer No results for input(s): DDIMER in the last 72 hours. Hgb A1c Recent Labs    01/10/21 0442  HGBA1C 6.9*   Lipid Profile Recent Labs    01/10/21 0442  CHOL 161  HDL 36*  LDLCALC 109*  TRIG 79  CHOLHDL 4.5   Thyroid function studies No results for input(s): TSH, T4TOTAL, T3FREE, THYROIDAB in the last 72 hours.  Invalid input(s): FREET3 Anemia work up Recent Labs    01/09/21 1227  VITAMINB12 >7,500*   Urinalysis    Component Value Date/Time   COLORURINE AMBER (A) 01/09/2021 1600   APPEARANCEUR CLOUDY (A) 01/09/2021 1600   LABSPEC 1.017 01/09/2021 1600   PHURINE 5.0 01/09/2021 1600   GLUCOSEU 50 (A) 01/09/2021 1600   HGBUR LARGE (A) 01/09/2021 1600   BILIRUBINUR NEGATIVE 01/09/2021 1600   KETONESUR 5 (A) 01/09/2021 1600   PROTEINUR 30 (A) 01/09/2021 1600   NITRITE NEGATIVE 01/09/2021 1600   LEUKOCYTESUR NEGATIVE 01/09/2021 1600   Sepsis Labs Invalid input(s): PROCALCITONIN,  WBC,  LACTICIDVEN Microbiology Recent Results (from the past 240 hour(s))  Resp Panel by RT-PCR (Flu A&B, Covid) Nasopharyngeal Swab     Status: None   Collection Time: 01/08/21  2:28 AM   Specimen: Nasopharyngeal Swab; Nasopharyngeal(NP) swabs in vial transport medium  Result Value Ref Range Status   SARS Coronavirus 2 by RT PCR NEGATIVE NEGATIVE Final    Comment: (NOTE) SARS-CoV-2 target nucleic acids are NOT DETECTED.  The SARS-CoV-2 RNA is generally detectable in upper respiratory specimens during the acute phase of infection. The lowest concentration of SARS-CoV-2 viral copies this assay can detect is 138 copies/mL. A negative result does not preclude SARS-Cov-2 infection and should not be used as the sole basis for treatment or other patient management decisions. A negative  result may occur with  improper specimen collection/handling, submission of specimen other than nasopharyngeal swab, presence of viral mutation(s) within the areas targeted by this assay, and inadequate number of viral copies(<138 copies/mL). A negative result must be combined with clinical observations, patient history, and epidemiological information. The expected result is Negative.  Fact Sheet for Patients:  BloggerCourse.com  Fact Sheet for Healthcare Providers:  SeriousBroker.it  This test is no t yet approved or cleared by the [place] FDA and  has been authorized for detection and/or diagnosis of SARS-CoV-2 by FDA under an Emergency Use Authorization (EUA). This EUA will remain  in effect (meaning this test can be used) for the duration of the COVID-19 declaration under Section 564(b)(1) of the Act, 21 U.S.C.section 360bbb-3(b)(1), unless the authorization is terminated  or revoked sooner.       Influenza A by PCR NEGATIVE NEGATIVE Final   Influenza B by PCR NEGATIVE NEGATIVE Final    Comment: (NOTE) The Xpert Xpress SARS-CoV-2/FLU/RSV plus assay is intended as an aid in the diagnosis of influenza from Nasopharyngeal swab specimens and should not be used as a sole basis for treatment. Nasal washings and aspirates are unacceptable for Xpert Xpress SARS-CoV-2/FLU/RSV testing.  Fact Sheet for Patients: BloggerCourse.com  Fact Sheet for Healthcare Providers: SeriousBroker.it  This test is not yet approved or cleared by the [place] FDA and has been authorized for detection and/or diagnosis of SARS-CoV-2 by FDA under an Emergency Use Authorization (EUA). This EUA will remain in effect (meaning this test can be used) for the duration of the  COVID-19 declaration under Section 564(b)(1) of the Act, 21 U.S.C. section 360bbb-3(b)(1), unless the authorization is  terminated or revoked.  Performed at [facility], [address] [place], [place] [address]   MRSA PCR Screening     Status: None   Collection Time: 01/08/21  4:34 AM   Specimen: Nasal Mucosa; Nasopharyngeal  Result Value Ref Range Status   MRSA by PCR NEGATIVE NEGATIVE Final    Comment:        The GeneXpert MRSA Assay (FDA approved for NASAL specimens only), is one component of a comprehensive MRSA colonization surveillance program. It is not intended to diagnose MRSA infection nor to guide or monitor treatment for MRSA infections. Performed at [facility], [address] [place], [place] [address]   CULTURE, BLOOD (ROUTINE X 2) w Reflex to ID Panel     Status: None (Preliminary result)   Collection Time: 01/09/21  2:57 PM   Specimen: BLOOD  Result Value Ref Range Status   Specimen Description BLOOD BLOOD RIGHT HAND  Final   Special Requests   Final    BOTTLES DRAWN AEROBIC ONLY Blood Culture results may not be optimal due to an inadequate volume of blood received in culture bottles   Culture   Final    NO GROWTH 3 DAYS Performed at [facility], [address] [place], [place] [address]    Report Status PENDING  Incomplete  CULTURE, BLOOD (ROUTINE X 2) w Reflex to ID Panel     Status: None (Preliminary result)   Collection Time: 01/09/21  3:20 PM   Specimen: BLOOD  Result Value Ref Range Status   Specimen Description BLOOD BLOOD LEFT ARM  Final   Special Requests   Final    BOTTLES DRAWN AEROBIC AND ANAEROBIC Blood Culture adequate volume   Culture   Final    NO GROWTH 3 DAYS Performed at [facility], [address], [place], [place] [address]    Report Status PENDING  Incomplete  Urine Culture     Status: None   Collection Time: 01/09/21  4:00 PM   Specimen: Urine, Random  Result Value Ref Range Status   Specimen Description   Final    URINE, RANDOM Performed at [facility], [address],  [place], [place] [address]    Special Requests   Final    NONE Performed at [facility], [address], [place], [place] [address]    Culture   Final    NO GROWTH Performed at [facility], [address], [place], [place] [address]    Report Status 01/11/2021 FINAL  Final     Time coordinating discharge: Over 30 minutes  SIGNED:   Lynn Ito, MD  [facility] Jan 27, 2021, 9:19 AM Pager   If 7PM-7AM, please contact night-coverage [email or web address] Password [email or web address]

## 2021-01-14 LAB — CULTURE, BLOOD (ROUTINE X 2)
Culture: NO GROWTH
Culture: NO GROWTH
Special Requests: ADEQUATE

## 2021-01-30 DEATH — deceased

## 2021-10-24 IMAGING — DX DG CHEST 1V
1 series · 1 of 1 positions shown · non-contrast
Comparison: 01/08/2021

CLINICAL DATA: Shortness of breath and weakness

EXAM:
CHEST  1 VIEW

[chest ap]
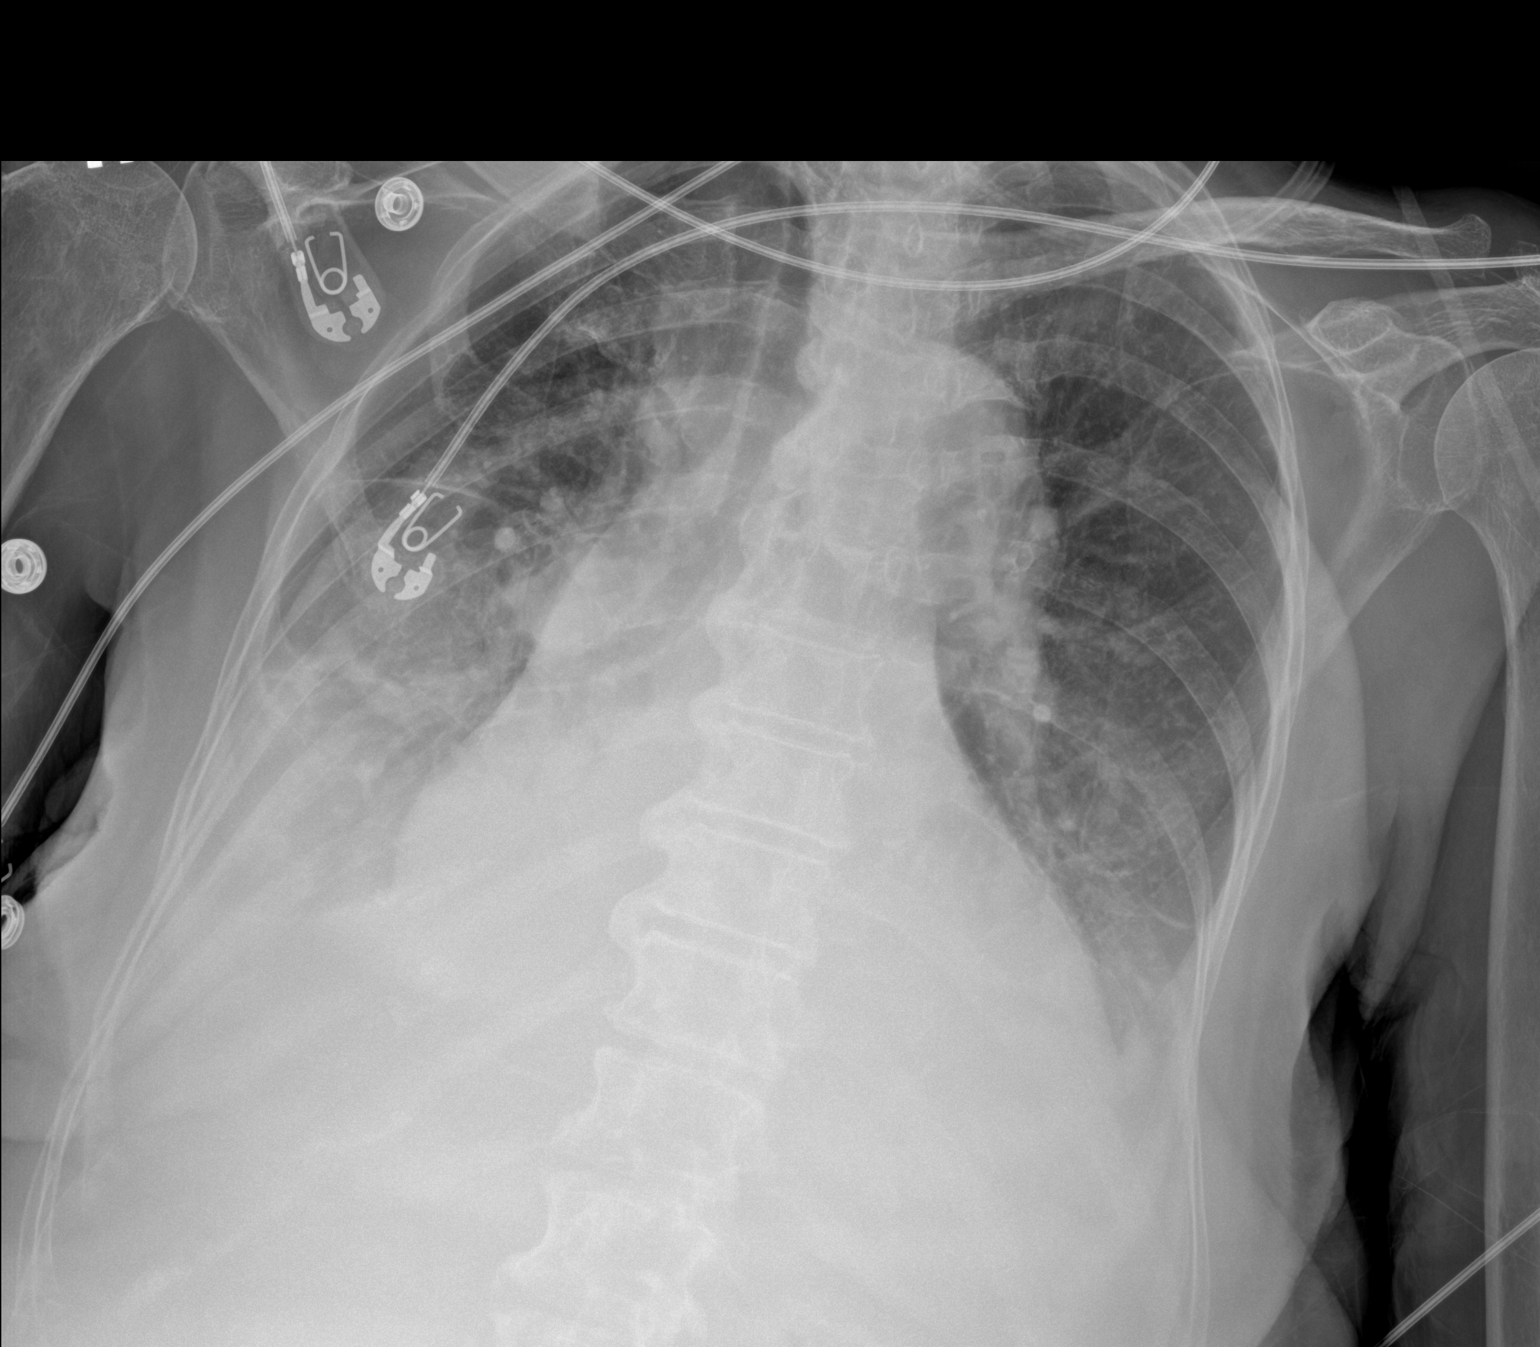

[1 of 1 positions shown; findings below may reference images not displayed]

FINDINGS: Cardiomegaly, bilateral pleural effusions and bilateral LOWER lung
atelectasis/consolidation noted.

There is no evidence of pneumothorax.

Mild pulmonary vascular congestion is present.

No acute bony abnormalities identified.
IMPRESSION: Cardiomegaly, bilateral pleural effusions and bilateral LOWER lung
atelectasis/consolidation.

## 2021-10-25 IMAGING — DX DG ABDOMEN 1V
1 series · 1 of 1 positions shown · non-contrast
Comparison: 01/09/2021 at [DATE] p.m.

CLINICAL DATA: Feeding tube placement

EXAM:
ABDOMEN - 1 VIEW

[abdomen supine]
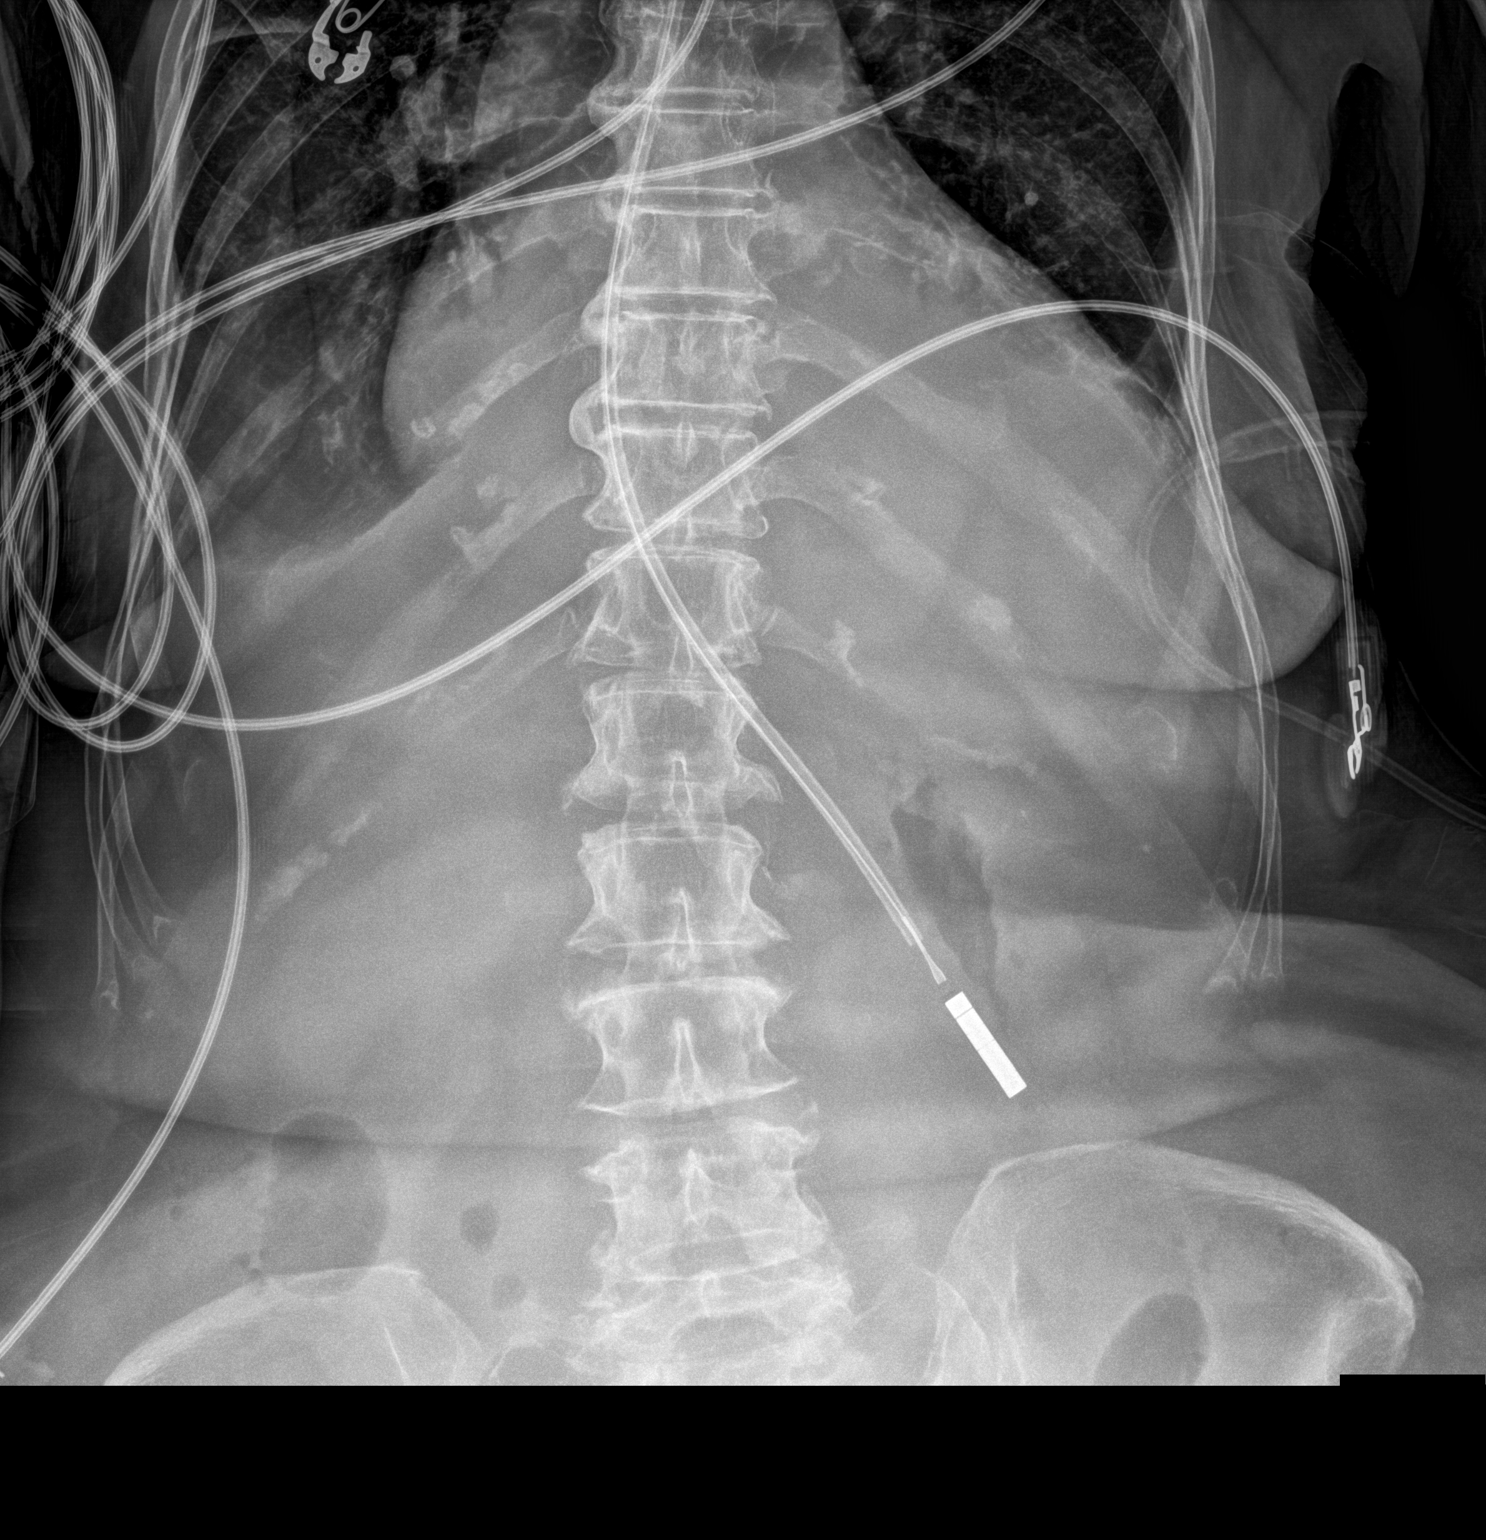

[1 of 1 positions shown; findings below may reference images not displayed]

FINDINGS: Nasoenteric feeding tube has been slightly withdrawn with its tip
now overlying the expected mid body of the stomach. Small right
pleural effusion is present. Retrocardiac opacification representing
atelectasis, infiltrate, or posteriorly layering pleural fluid again
noted. Mild cardiomegaly noted. The visualized abdominal gas pattern
is unremarkable. Pelvis excluded from view.
IMPRESSION: Nasoenteric feeding tube tip within the mid body of the stomach.
